# Patient Record
Sex: Female | Born: 1935
Health system: Southern US, Community
[De-identification: ages and names within clinical notes are randomized; demographics above are authoritative.]

## PROBLEM LIST (undated history)

## (undated) DIAGNOSIS — I739 Peripheral vascular disease, unspecified: Secondary | ICD-10-CM

## (undated) DIAGNOSIS — E039 Hypothyroidism, unspecified: Secondary | ICD-10-CM

## (undated) DIAGNOSIS — G4730 Sleep apnea, unspecified: Secondary | ICD-10-CM

## (undated) DIAGNOSIS — I779 Disorder of arteries and arterioles, unspecified: Secondary | ICD-10-CM

## (undated) DIAGNOSIS — K219 Gastro-esophageal reflux disease without esophagitis: Secondary | ICD-10-CM

## (undated) DIAGNOSIS — I509 Heart failure, unspecified: Secondary | ICD-10-CM

## (undated) DIAGNOSIS — I10 Essential (primary) hypertension: Secondary | ICD-10-CM

## (undated) DIAGNOSIS — I499 Cardiac arrhythmia, unspecified: Secondary | ICD-10-CM

## (undated) DIAGNOSIS — R55 Syncope and collapse: Secondary | ICD-10-CM

## (undated) DIAGNOSIS — Z950 Presence of cardiac pacemaker: Secondary | ICD-10-CM

## (undated) HISTORY — DX: Sleep apnea, unspecified: G47.30

## (undated) HISTORY — DX: Peripheral vascular disease, unspecified: I73.9

## (undated) HISTORY — DX: Heart failure, unspecified: I50.9

## (undated) HISTORY — DX: Syncope and collapse: R55

## (undated) HISTORY — PX: BACK SURGERY: SHX140

## (undated) HISTORY — DX: Gastro-esophageal reflux disease without esophagitis: K21.9

## (undated) HISTORY — DX: Presence of cardiac pacemaker: Z95.0

## (undated) HISTORY — DX: Hypothyroidism, unspecified: E03.9

## (undated) HISTORY — PX: PACEMAKER INSERTION: SHX728

## (undated) HISTORY — DX: Essential (primary) hypertension: I10

## (undated) HISTORY — DX: Disorder of arteries and arterioles, unspecified: I77.9

---

## 2002-08-26 ENCOUNTER — Encounter (HOSPITAL_COMMUNITY): Admission: RE | Admit: 2002-08-26 | Discharge: 2002-09-25 | Payer: Self-pay | Admitting: Internal Medicine

## 2002-08-27 ENCOUNTER — Encounter: Payer: Self-pay | Admitting: Internal Medicine

## 2002-10-10 ENCOUNTER — Inpatient Hospital Stay (HOSPITAL_COMMUNITY): Admission: AD | Admit: 2002-10-10 | Discharge: 2002-10-14 | Payer: Self-pay | Admitting: Internal Medicine

## 2003-08-20 ENCOUNTER — Other Ambulatory Visit: Admission: RE | Admit: 2003-08-20 | Discharge: 2003-08-20 | Payer: Self-pay | Admitting: Dermatology

## 2003-10-12 ENCOUNTER — Other Ambulatory Visit: Admission: RE | Admit: 2003-10-12 | Discharge: 2003-10-12 | Payer: Self-pay | Admitting: Dermatology

## 2003-12-29 ENCOUNTER — Ambulatory Visit (HOSPITAL_COMMUNITY): Admission: RE | Admit: 2003-12-29 | Discharge: 2003-12-29 | Payer: Self-pay | Admitting: Internal Medicine

## 2005-03-06 ENCOUNTER — Encounter (HOSPITAL_COMMUNITY): Admission: RE | Admit: 2005-03-06 | Discharge: 2005-04-05 | Payer: Self-pay | Admitting: Orthopaedic Surgery

## 2007-04-08 ENCOUNTER — Ambulatory Visit (HOSPITAL_COMMUNITY): Admission: RE | Admit: 2007-04-08 | Discharge: 2007-04-08 | Payer: Self-pay | Admitting: Internal Medicine

## 2007-04-16 ENCOUNTER — Ambulatory Visit: Payer: Self-pay | Admitting: Vascular Surgery

## 2007-05-02 ENCOUNTER — Ambulatory Visit: Payer: Self-pay | Admitting: Vascular Surgery

## 2007-05-02 ENCOUNTER — Encounter: Payer: Self-pay | Admitting: Vascular Surgery

## 2007-05-02 ENCOUNTER — Inpatient Hospital Stay (HOSPITAL_COMMUNITY): Admission: AD | Admit: 2007-05-02 | Discharge: 2007-05-06 | Payer: Self-pay | Admitting: Vascular Surgery

## 2007-05-21 ENCOUNTER — Ambulatory Visit: Payer: Self-pay | Admitting: Vascular Surgery

## 2007-11-12 ENCOUNTER — Ambulatory Visit: Payer: Self-pay | Admitting: Vascular Surgery

## 2008-05-14 ENCOUNTER — Ambulatory Visit: Payer: Self-pay | Admitting: *Deleted

## 2009-06-27 ENCOUNTER — Encounter: Payer: Self-pay | Admitting: Emergency Medicine

## 2009-06-27 ENCOUNTER — Inpatient Hospital Stay (HOSPITAL_COMMUNITY): Admission: EM | Admit: 2009-06-27 | Discharge: 2009-07-02 | Payer: Self-pay | Admitting: Internal Medicine

## 2009-06-27 ENCOUNTER — Ambulatory Visit: Payer: Self-pay | Admitting: Cardiovascular Disease

## 2009-06-28 ENCOUNTER — Encounter: Payer: Self-pay | Admitting: Cardiovascular Disease

## 2009-06-29 ENCOUNTER — Encounter: Payer: Self-pay | Admitting: Internal Medicine

## 2009-06-30 ENCOUNTER — Encounter: Payer: Self-pay | Admitting: Internal Medicine

## 2009-07-16 ENCOUNTER — Encounter (INDEPENDENT_AMBULATORY_CARE_PROVIDER_SITE_OTHER): Payer: Self-pay | Admitting: *Deleted

## 2009-07-22 ENCOUNTER — Encounter: Payer: Self-pay | Admitting: Internal Medicine

## 2009-07-22 ENCOUNTER — Ambulatory Visit: Payer: Self-pay

## 2009-07-23 ENCOUNTER — Inpatient Hospital Stay (HOSPITAL_COMMUNITY): Admission: EM | Admit: 2009-07-23 | Discharge: 2009-07-27 | Payer: Self-pay | Admitting: Emergency Medicine

## 2009-09-29 ENCOUNTER — Encounter (INDEPENDENT_AMBULATORY_CARE_PROVIDER_SITE_OTHER): Payer: Self-pay | Admitting: *Deleted

## 2009-11-01 ENCOUNTER — Ambulatory Visit: Payer: Self-pay | Admitting: Surgery

## 2009-11-27 ENCOUNTER — Inpatient Hospital Stay (HOSPITAL_COMMUNITY)
Admission: EM | Admit: 2009-11-27 | Discharge: 2009-11-29 | Payer: Self-pay | Source: Home / Self Care | Admitting: Emergency Medicine

## 2009-12-02 ENCOUNTER — Ambulatory Visit: Payer: Self-pay | Admitting: Internal Medicine

## 2009-12-02 DIAGNOSIS — I10 Essential (primary) hypertension: Secondary | ICD-10-CM | POA: Insufficient documentation

## 2009-12-02 DIAGNOSIS — I471 Supraventricular tachycardia: Secondary | ICD-10-CM | POA: Insufficient documentation

## 2009-12-02 DIAGNOSIS — Z950 Presence of cardiac pacemaker: Secondary | ICD-10-CM | POA: Insufficient documentation

## 2009-12-03 ENCOUNTER — Encounter: Payer: Self-pay | Admitting: Internal Medicine

## 2010-05-15 ENCOUNTER — Encounter: Payer: Self-pay | Admitting: Internal Medicine

## 2010-05-26 NOTE — Procedures (Signed)
Summary: wch/resch/jss   Current Medications (verified): 1)  Norvasc 10 Mg Tabs (Amlodipine Besylate) .... One By Mouth Daily 2)  Aspir-Low 81 Mg Tbec (Aspirin) .... One By Mouth Daily 3)  Levothroid 88 Mcg Tabs (Levothyroxine Sodium) .... One By Mouth Daily 4)  Lisinopril 20 Mg Tabs (Lisinopril) .... One By Mouth Daily 5)  Omeprazole 20 Mg Cpdr (Omeprazole) .... One By Mouth Daily 6)  Lasix 20 Mg Tabs (Furosemide) .... One By Mouth Daily 7)  Colcrys 0.6 Mg Tabs (Colchicine) .... One By Mouth Three Times A Day For 5 Days 8)  Oxycontin 10 Mg Xr12h-Tab (Oxycodone Hcl) .... One By Mouth Two Times A Day As Needed 9)  Imodium A-D 2 Mg Tabs (Loperamide Hcl) .... One By Mouth Three Times A Day As Needed 10)  Promethazine Hcl 25 Mg Tabs (Promethazine Hcl) .... One By Mouth Every 8 Hours As Neeed.  Allergies (verified): 1)  Penicillin 2)  Sulfa  PPM Specifications Following MD:  Hillis Range, MD     PPM Vendor:  Medtronic     PPM Model Number:  ADDRL1     PPM Serial Number:  [id number] PPM DOI:  06/29/2009     PPM Implanting MD:  Hillis Range, MD  Lead 1    Location: RA     DOI: 06/29/2009     Model #: [id number]     Serial #: [id number]     Status: active Lead 2    Location: RV     DOI: 06/29/2009     Model #: [id number]     Serial #: [id number]     Status: active  Magnet Response Rate:  BOL 85 ERI 65  Indications:  AV block Type II   PPM Follow Up Remote Check?  No Battery Voltage:  2.80 V     Battery Est. Longevity:  9 years     Pacer Dependent:  No       PPM Device Measurements Atrium  Amplitude: 1.4 mV, Impedance: 400 ohms, Threshold: 0.5 V at 0.4 msec Right Ventricle  Amplitude: 15.68 mV, Impedance: 648 ohms, Threshold: 0.625 V at 0.4 msec  Episodes MS Episodes:  0     Percent Mode Switch:  0     Coumadin:  No Ventricular High Rate:  0     Atrial Pacing:  4.6%     Ventricular Pacing:  40.1%  Parameters Mode:  DDD+     Lower Rate Limit:  60     Upper Rate Limit:  130 Paced AV Delay:   150     Sensed AV Delay:  120 Next Cardiology Appt Due:  09/22/2009 Tech Comments:  Reprogrammed DDD+.  Steri strips removed, no redness or edema.  She will follow with Dr. Ladona Ridgel in the [facility] office. Altha Harm, LPN  July 22, 2009 2:43 PM

## 2010-05-26 NOTE — Letter (Signed)
Summary: Appointment - Missed  [facility] [place]  [address] [place] [address]   Phone: [phone]  Fax: [phone]     September 29, 2009 MRN: [id number]   [facility] [address] [place], [place]  [address]   Dear Ms. Ehrich,  Our records indicate you missed your appointment on     09/29/09                   with Dr.       Ladona Ridgel                                    It is very important that we reach you to reschedule this appointment. We look forward to participating in your health care needs. Please contact us at the number listed above at your earliest convenience to reschedule this appointment.     Sincerely,    Glass blower/designer

## 2010-05-26 NOTE — Cardiovascular Report (Signed)
Summary: Office Visit   Office Visit   Imported By: Roderic Ovens 07/30/2009 13:27:24  _____________________________________________________________________  External Attachment:    Type:   Image     Comment:   External Document

## 2010-05-26 NOTE — Letter (Signed)
Summary: Appointment - Missed   [facility]  [address]   [place], [place] [address]   Phone: [phone]  Fax: [phone]     July 16, 2009 MRN: [id number]   [facility] [address] [place], [place]  [address]   Dear Ms. Kreitz,  Our records indicate you missed your appointment on 07/14/2009 with [facility].It is very important that we reach you to reschedule this appointment. We look forward to participating in your health care needs. Please contact us at the number listed above at your earliest convenience to reschedule this appointment.     Sincerely,   Ruel Favors Scheduling Team

## 2010-05-26 NOTE — Miscellaneous (Signed)
Summary: Device preload  Clinical Lists Changes  Observations: Added new observation of PPM INDICATN: AV block Type II (06/29/2009 16:09) Added new observation of MAGNET RTE: BOL 85 ERI 65 (06/29/2009 16:09) Added new observation of PPMLEADSTAT2: active (06/29/2009 16:09) Added new observation of PPMLEADSER2: [id number] (06/29/2009 16:09) Added new observation of PPMLEADMOD2: 5092  (06/29/2009 16:09) Added new observation of PPMLEADLOC2: RV  (06/29/2009 16:09) Added new observation of PPMLEADSTAT1: active  (06/29/2009 16:09) Added new observation of PPMLEADSER1: [id number]  (06/29/2009 16:09) Added new observation of PPMLEADMOD1: 5076  (06/29/2009 16:09) Added new observation of PPMLEADLOC1: RA  (06/29/2009 16:09) Added new observation of PPM IMP MD: Hillis Range, MD  (06/29/2009 16:09) Added new observation of PPMLEADDOI2: 06/29/2009  (06/29/2009 16:09) Added new observation of PPMLEADDOI1: 06/29/2009  (06/29/2009 16:09) Added new observation of PPM DOI: 06/29/2009  (06/29/2009 16:09) Added new observation of PPM SERL#: [id number]  (06/29/2009 16:09) Added new observation of PPM MODL#: [id number]  (06/29/2009 36:64) Added new observation of PACEMAKERMFG: Medtronic  (06/29/2009 16:09) Added new observation of PACEMAKER MD: Hillis Range, MD  (06/29/2009 16:09)      PPM Specifications Following MD:  Hillis Range, MD     PPM Vendor:  Medtronic     PPM Model Number:  ADDRL1     PPM Serial Number:  [id number] PPM DOI:  06/29/2009     PPM Implanting MD:  Hillis Range, MD  Lead 1    Location: RA     DOI: 06/29/2009     Model #: 5638     Serial #: [id number]     Status: active Lead 2    Location: RV     DOI: 06/29/2009     Model #: [id number]     Serial #: [id number]     Status: active  Magnet Response Rate:  BOL 85 ERI 65  Indications:  AV block Type II

## 2010-05-26 NOTE — Procedures (Signed)
Summary: PC2   Visit Type:  Follow-up Primary Lazaro Isenhower:  Osborne Casco  CC:  no cardiology complaints.  History of Present Illness: Mrs. Canedo returns for followup.  She is a pleasant 77 yo woman with a h/o symptomatic bradycardia, s/p PPM, who also has HTN and arthritis.  She had a spell of near syncope a couple of weeks ago and was evaluated at [facility] hospital where she was stable and discharged with a presumptive diagnosis of dysautonomia.  Interogation of her PPM demonstrated that she had had no arrhythmias.  She denies c/p or sob.  She has multiple joint aches.  Current Medications (verified): 1)  Norvasc 10 Mg Tabs (Amlodipine Besylate) .... One By Mouth Daily 2)  Aspir-Low 81 Mg Tbec (Aspirin) .... One By Mouth Daily 3)  Levothroid 88 Mcg Tabs (Levothyroxine Sodium) .... One By Mouth Daily 4)  Lisinopril 20 Mg Tabs (Lisinopril) .... One By Mouth Daily 5)  Aciphex 20 Mg Tbec (Rabeprazole Sodium) .... Take 1 Tab Daily  Allergies (verified): 1)  Penicillin 2)  Sulfa  Review of Systems       The patient complains of syncope.  The patient denies chest pain, dyspnea on exertion, and peripheral edema.    Vital Signs:  Patient profile:   77 year old female Height:      62 inches Weight:      161 pounds BMI:     29.55 Pulse rate:   90 / minute BP sitting:   143 / 73  (right arm)  Vitals Entered By: Dreama Saa, CNA (December 02, 2009 11:02 AM)  Physical Exam  General:  Elderly, well developed, well nourished, in no acute distress.  HEENT: normal except for alopecia Neck: supple. No JVD. Carotids 2+ bilaterally no bruits Cor: RRR no rubs, gallops or murmur Lungs: CTA. Well healed PPM incision. Ab: soft, nontender. nondistended. No HSM. Good bowel sounds Ext: warm. no cyanosis, clubbing or edema Neuro: alert and oriented. Grossly nonfocal. affect pleasant    PPM Specifications Following MD:  Hillis Range, MD     PPM Vendor:  Medtronic     PPM Model Number:  ADDRL1      PPM Serial Number:  [id number] PPM DOI:  06/29/2009     PPM Implanting MD:  Hillis Range, MD  Lead 1    Location: RA     DOI: 06/29/2009     Model #: 4098     Serial #: [id number]     Status: active Lead 2    Location: RV     DOI: 06/29/2009     Model #: 6213     Serial #: [id number]     Status: active  Magnet Response Rate:  BOL 85 ERI 65  Indications:  AV block Type II   PPM Follow Up Remote Check?  No Battery Voltage:  2.80 V     Battery Est. Longevity:  13.5 years     Pacer Dependent:  No       PPM Device Measurements Atrium  Amplitude: 1.4 mV, Impedance: 450 ohms, Threshold: 0.5 V at 0.4 msec Right Ventricle  Amplitude: 8.0 mV, Impedance: 580 ohms, Threshold: 0.375 V at 0.4 msec  Episodes MS Episodes:  6     Percent Mode Switch:  <0.1%     Coumadin:  No Ventricular High Rate:  1     Atrial Pacing:  0.2%     Ventricular Pacing:  50.2%  Parameters Mode:  DDD+     Lower Rate  Limit:  60     Upper Rate Limit:  130 Paced AV Delay:  150     Sensed AV Delay:  120 Next Cardiology Appt Due:  06/23/2010 Tech Comments:  Ms. Blowe was seen today for a near syncopal episode 8/6.  No episodes recorded on that day.  No parameter changes.  1VHR episode 3 seconds.  ROV 3/12 with Dr. Ladona Ridgel in [facility]. Altha Harm, LPN  December 02, 2009 11:27 AM  MD Comments:  Agree with above.  Impression & Recommendations:  Problem # 1:  CARDIAC PACEMAKER IN SITU (ICD-V45.01) Her device is working normally.  Will recheck in several months.  Problem # 2:  ESSENTIAL HYPERTENSION, BENIGN (ICD-401.1) Her blood pressure is a bit elevated today but in light of her propensity for probable syncope, would allow it to run a bit on the high side particularly with her advanced age. The following medications were removed from the medication list:    Lasix 20 Mg Tabs (Furosemide) ..... One by mouth daily Her updated medication list for this problem includes:    Norvasc 10 Mg Tabs (Amlodipine besylate) ..... One by  mouth daily    Aspir-low 81 Mg Tbec (Aspirin) ..... One by mouth daily    Lisinopril 20 Mg Tabs (Lisinopril) ..... One by mouth daily  Problem # 3:  SUPRAVENTRICULAR ARRHYTHMIA (ICD-427.0) Interogation of her PM demonstrates some non-sustained SVT.  This appears to be asymptomatic and does not coincide with her prior episode of near syncope. Her updated medication list for this problem includes:    Norvasc 10 Mg Tabs (Amlodipine besylate) ..... One by mouth daily    Aspir-low 81 Mg Tbec (Aspirin) ..... One by mouth daily    Lisinopril 20 Mg Tabs (Lisinopril) ..... One by mouth daily  Patient Instructions: 1)  Your physician recommends that you schedule a follow-up appointment in: March, 2012

## 2010-07-08 LAB — URINALYSIS, ROUTINE W REFLEX MICROSCOPIC
Glucose, UA: NEGATIVE mg/dL
Hgb urine dipstick: NEGATIVE
Leukocytes, UA: NEGATIVE
Nitrite: NEGATIVE
Protein, ur: 30 mg/dL — AB
Specific Gravity, Urine: >1.03 — ABNORMAL HIGH (ref 1.005–1.030)
Urobilinogen, UA: 1 mg/dL (ref 0.0–1.0)
pH: 5 (ref 5.0–8.0)

## 2010-07-08 LAB — CBC
HCT: 34.3 % — ABNORMAL LOW (ref 36.0–46.0)
Hemoglobin: 11.6 g/dL — ABNORMAL LOW (ref 12.0–15.0)
MCH: 30.1 pg (ref 26.0–34.0)
MCHC: 33.6 g/dL (ref 30.0–36.0)
MCV: 89.6 fL (ref 78.0–100.0)
Platelets: 157 10*3/uL (ref 150–400)
RBC: 3.83 MIL/uL — ABNORMAL LOW (ref 3.87–5.11)
RDW: 13.8 % (ref 11.5–15.5)
WBC: 4.9 10*3/uL (ref 4.0–10.5)

## 2010-07-08 LAB — DIFFERENTIAL
Basophils Absolute: 0 10*3/uL (ref 0.0–0.1)
Basophils Relative: 1 % (ref 0–1)
Eosinophils Absolute: 0.2 10*3/uL (ref 0.0–0.7)
Eosinophils Relative: 4 % (ref 0–5)
Lymphocytes Relative: 19 % (ref 12–46)
Lymphs Abs: 0.9 10*3/uL (ref 0.7–4.0)
Monocytes Absolute: 0.4 10*3/uL (ref 0.1–1.0)
Monocytes Relative: 8 % (ref 3–12)
Neutro Abs: 3.4 10*3/uL (ref 1.7–7.7)
Neutrophils Relative %: 69 % (ref 43–77)

## 2010-07-08 LAB — GLUCOSE, CAPILLARY: Glucose-Capillary: 104 mg/dL — ABNORMAL HIGH (ref 70–99)

## 2010-07-08 LAB — CARDIAC PANEL(CRET KIN+CKTOT+MB+TROPI)
CK, MB: 0.8 ng/mL (ref 0.3–4.0)
CK, MB: 0.9 ng/mL (ref 0.3–4.0)
Relative Index: INVALID (ref 0.0–2.5)
Relative Index: INVALID (ref 0.0–2.5)
Relative Index: INVALID (ref 0.0–2.5)
Total CK: 20 U/L (ref 7–177)
Total CK: 20 U/L (ref 7–177)
Total CK: 21 U/L (ref 7–177)
Troponin I: 0.01 ng/mL (ref 0.00–0.06)

## 2010-07-08 LAB — URINE MICROSCOPIC-ADD ON

## 2010-07-08 LAB — URINE CULTURE
Colony Count: >=100000
Culture  Setup Time: 201108071946
Special Requests: NEGATIVE

## 2010-07-08 LAB — BASIC METABOLIC PANEL
BUN: 18 mg/dL (ref 6–23)
CO2: 26 mEq/L (ref 19–32)
Calcium: 9.6 mg/dL (ref 8.4–10.5)
Chloride: 107 mEq/L (ref 96–112)
Creatinine, Ser: 1.25 mg/dL — ABNORMAL HIGH (ref 0.4–1.2)
GFR calc Af Amer: 49 mL/min — ABNORMAL LOW (ref >60)
GFR calc non Af Amer: 40 mL/min — ABNORMAL LOW (ref >60)
Glucose, Bld: 115 mg/dL — ABNORMAL HIGH (ref 70–99)
Potassium: 4.1 mEq/L (ref 3.5–5.1)
Sodium: 136 mEq/L (ref 135–145)

## 2010-07-08 LAB — COMPREHENSIVE METABOLIC PANEL
Albumin: 3.3 g/dL — ABNORMAL LOW (ref 3.5–5.2)
BUN: 17 mg/dL (ref 6–23)
Calcium: 9 mg/dL (ref 8.4–10.5)
Creatinine, Ser: 1.11 mg/dL (ref 0.4–1.2)
Potassium: 4.3 mEq/L (ref 3.5–5.1)
Total Protein: 5.9 g/dL — ABNORMAL LOW (ref 6.0–8.3)

## 2010-07-08 LAB — POCT CARDIAC MARKERS
CKMB, poc: <1 ng/mL — ABNORMAL LOW (ref 1.0–8.0)
Myoglobin, poc: 67.2 ng/mL (ref 12–200)
Troponin i, poc: <0.05 ng/mL (ref 0.00–0.09)

## 2010-07-08 LAB — BRAIN NATRIURETIC PEPTIDE: Pro B Natriuretic peptide (BNP): 71.2 pg/mL (ref 0.0–100.0)

## 2010-07-08 LAB — MAGNESIUM: Magnesium: 2 mg/dL (ref 1.5–2.5)

## 2010-07-08 LAB — PHOSPHORUS: Phosphorus: 3.3 mg/dL (ref 2.3–4.6)

## 2010-07-13 LAB — DIFFERENTIAL
Basophils Absolute: 0 10*3/uL (ref 0.0–0.1)
Basophils Absolute: 0 10*3/uL (ref 0.0–0.1)
Eosinophils Absolute: 0.1 10*3/uL (ref 0.0–0.7)
Eosinophils Absolute: 0.1 10*3/uL (ref 0.0–0.7)
Eosinophils Absolute: 0.1 10*3/uL (ref 0.0–0.7)
Eosinophils Relative: 3 % (ref 0–5)
Eosinophils Relative: 4 % (ref 0–5)
Lymphocytes Relative: 15 % (ref 12–46)
Lymphocytes Relative: 21 % (ref 12–46)
Lymphs Abs: 0.3 10*3/uL — ABNORMAL LOW (ref 0.7–4.0)
Lymphs Abs: 0.5 10*3/uL — ABNORMAL LOW (ref 0.7–4.0)
Monocytes Absolute: 0.2 10*3/uL (ref 0.1–1.0)
Monocytes Absolute: 0.3 10*3/uL (ref 0.1–1.0)
Monocytes Relative: 12 % (ref 3–12)
Monocytes Relative: 14 % — ABNORMAL HIGH (ref 3–12)
Neutro Abs: 1.2 10*3/uL — ABNORMAL LOW (ref 1.7–7.7)
Neutrophils Relative %: 68 % (ref 43–77)
Neutrophils Relative %: 72 % (ref 43–77)

## 2010-07-13 LAB — CBC
HCT: 30.8 % — ABNORMAL LOW (ref 36.0–46.0)
HCT: 30.8 % — ABNORMAL LOW (ref 36.0–46.0)
HCT: 31.9 % — ABNORMAL LOW (ref 36.0–46.0)
Hemoglobin: 10.7 g/dL — ABNORMAL LOW (ref 12.0–15.0)
Hemoglobin: 12.6 g/dL (ref 12.0–15.0)
MCHC: 34.5 g/dL (ref 30.0–36.0)
MCHC: 34.8 g/dL (ref 30.0–36.0)
MCV: 88.8 fL (ref 78.0–100.0)
MCV: 89.2 fL (ref 78.0–100.0)
Platelets: 87 10*3/uL — ABNORMAL LOW (ref 150–400)
Platelets: 98 10*3/uL — ABNORMAL LOW (ref 150–400)
RBC: 3.46 MIL/uL — ABNORMAL LOW (ref 3.87–5.11)
RBC: 4.07 MIL/uL (ref 3.87–5.11)
RDW: 14.1 % (ref 11.5–15.5)
WBC: 2.3 10*3/uL — ABNORMAL LOW (ref 4.0–10.5)
WBC: 3.3 10*3/uL — ABNORMAL LOW (ref 4.0–10.5)

## 2010-07-13 LAB — URINALYSIS, MICROSCOPIC ONLY
Hgb urine dipstick: NEGATIVE
Nitrite: NEGATIVE
Specific Gravity, Urine: 1.025 (ref 1.005–1.030)
Urobilinogen, UA: 0.2 mg/dL (ref 0.0–1.0)

## 2010-07-13 LAB — COMPREHENSIVE METABOLIC PANEL
ALT: 42 U/L — ABNORMAL HIGH (ref 0–35)
ALT: 56 U/L — ABNORMAL HIGH (ref 0–35)
AST: 123 U/L — ABNORMAL HIGH (ref 0–37)
Albumin: 2.4 g/dL — ABNORMAL LOW (ref 3.5–5.2)
Albumin: 2.5 g/dL — ABNORMAL LOW (ref 3.5–5.2)
Alkaline Phosphatase: 146 U/L — ABNORMAL HIGH (ref 39–117)
Alkaline Phosphatase: 152 U/L — ABNORMAL HIGH (ref 39–117)
BUN: 15 mg/dL (ref 6–23)
BUN: 31 mg/dL — ABNORMAL HIGH (ref 6–23)
CO2: 21 mEq/L (ref 19–32)
CO2: 22 mEq/L (ref 19–32)
CO2: 24 mEq/L (ref 19–32)
Calcium: 8.6 mg/dL (ref 8.4–10.5)
Calcium: 9.2 mg/dL (ref 8.4–10.5)
Chloride: 104 mEq/L (ref 96–112)
Chloride: 110 mEq/L (ref 96–112)
Chloride: 110 mEq/L (ref 96–112)
Creatinine, Ser: 1.31 mg/dL — ABNORMAL HIGH (ref 0.4–1.2)
Creatinine, Ser: 1.78 mg/dL — ABNORMAL HIGH (ref 0.4–1.2)
Creatinine, Ser: 2.02 mg/dL — ABNORMAL HIGH (ref 0.4–1.2)
GFR calc non Af Amer: 23 mL/min — ABNORMAL LOW (ref >60)
GFR calc non Af Amer: 27 mL/min — ABNORMAL LOW (ref >60)
GFR calc non Af Amer: 33 mL/min — ABNORMAL LOW (ref >60)
Glucose, Bld: 115 mg/dL — ABNORMAL HIGH (ref 70–99)
Glucose, Bld: 84 mg/dL (ref 70–99)
Potassium: 3.5 mEq/L (ref 3.5–5.1)
Potassium: 4.2 mEq/L (ref 3.5–5.1)
Sodium: 136 mEq/L (ref 135–145)
Sodium: 136 mEq/L (ref 135–145)
Total Bilirubin: 0.6 mg/dL (ref 0.3–1.2)
Total Bilirubin: 0.7 mg/dL (ref 0.3–1.2)
Total Bilirubin: 0.7 mg/dL (ref 0.3–1.2)
Total Protein: 5.2 g/dL — ABNORMAL LOW (ref 6.0–8.3)

## 2010-07-13 LAB — FECAL LACTOFERRIN, QUANT: Fecal Lactoferrin: POSITIVE

## 2010-07-13 LAB — CLOSTRIDIUM DIFFICILE EIA: C difficile Toxins A+B, EIA: NEGATIVE

## 2010-07-13 LAB — LIPASE, BLOOD: Lipase: 39 U/L (ref 11–59)

## 2010-07-13 LAB — POCT CARDIAC MARKERS: Troponin i, poc: <0.05 ng/mL (ref 0.00–0.09)

## 2010-07-13 LAB — OVA AND PARASITE EXAMINATION: Ova and parasites: NONE SEEN

## 2010-07-13 LAB — STOOL CULTURE

## 2010-07-17 LAB — COMPREHENSIVE METABOLIC PANEL
Albumin: 3.7 g/dL (ref 3.5–5.2)
BUN: 25 mg/dL — ABNORMAL HIGH (ref 6–23)
Calcium: 9.4 mg/dL (ref 8.4–10.5)
Chloride: 106 mEq/L (ref 96–112)
Creatinine, Ser: 1.45 mg/dL — ABNORMAL HIGH (ref 0.4–1.2)
Total Bilirubin: 0.8 mg/dL (ref 0.3–1.2)
Total Protein: 7.3 g/dL (ref 6.0–8.3)

## 2010-07-17 LAB — URINALYSIS, ROUTINE W REFLEX MICROSCOPIC
Bilirubin Urine: NEGATIVE
Glucose, UA: NEGATIVE mg/dL
Hgb urine dipstick: NEGATIVE
Protein, ur: NEGATIVE mg/dL
Specific Gravity, Urine: 1.01 (ref 1.005–1.030)
pH: 5 (ref 5.0–8.0)

## 2010-07-17 LAB — CBC
HCT: 31.8 % — ABNORMAL LOW (ref 36.0–46.0)
HCT: 31.9 % — ABNORMAL LOW (ref 36.0–46.0)
Hemoglobin: 10.7 g/dL — ABNORMAL LOW (ref 12.0–15.0)
Hemoglobin: 11.1 g/dL — ABNORMAL LOW (ref 12.0–15.0)
MCHC: 33.7 g/dL (ref 30.0–36.0)
MCHC: 34 g/dL (ref 30.0–36.0)
MCV: 93 fL (ref 78.0–100.0)
MCV: 93.3 fL (ref 78.0–100.0)
MCV: 93.4 fL (ref 78.0–100.0)
Platelets: 120 10*3/uL — ABNORMAL LOW (ref 150–400)
Platelets: 124 10*3/uL — ABNORMAL LOW (ref 150–400)
Platelets: 130 10*3/uL — ABNORMAL LOW (ref 150–400)
RBC: 3.43 MIL/uL — ABNORMAL LOW (ref 3.87–5.11)
RBC: 3.47 MIL/uL — ABNORMAL LOW (ref 3.87–5.11)
RDW: 14.2 % (ref 11.5–15.5)
WBC: 5.4 10*3/uL (ref 4.0–10.5)
WBC: 6.6 10*3/uL (ref 4.0–10.5)
WBC: 8.1 10*3/uL (ref 4.0–10.5)

## 2010-07-17 LAB — BASIC METABOLIC PANEL
BUN: 25 mg/dL — ABNORMAL HIGH (ref 6–23)
BUN: 25 mg/dL — ABNORMAL HIGH (ref 6–23)
BUN: 28 mg/dL — ABNORMAL HIGH (ref 6–23)
CO2: 26 mEq/L (ref 19–32)
CO2: 28 mEq/L (ref 19–32)
CO2: 28 mEq/L (ref 19–32)
Calcium: 8.9 mg/dL (ref 8.4–10.5)
Chloride: 104 mEq/L (ref 96–112)
Chloride: 105 mEq/L (ref 96–112)
Chloride: 106 mEq/L (ref 96–112)
Chloride: 107 mEq/L (ref 96–112)
Chloride: 107 mEq/L (ref 96–112)
Creatinine, Ser: 1.46 mg/dL — ABNORMAL HIGH (ref 0.4–1.2)
Creatinine, Ser: 1.59 mg/dL — ABNORMAL HIGH (ref 0.4–1.2)
Creatinine, Ser: 1.76 mg/dL — ABNORMAL HIGH (ref 0.4–1.2)
GFR calc Af Amer: 33 mL/min — ABNORMAL LOW (ref >60)
GFR calc non Af Amer: 31 mL/min — ABNORMAL LOW (ref >60)
Glucose, Bld: 101 mg/dL — ABNORMAL HIGH (ref 70–99)
Potassium: 3.5 mEq/L (ref 3.5–5.1)
Potassium: 3.6 mEq/L (ref 3.5–5.1)
Potassium: 4.7 mEq/L (ref 3.5–5.1)
Sodium: 137 mEq/L (ref 135–145)
Sodium: 139 mEq/L (ref 135–145)

## 2010-07-17 LAB — DIFFERENTIAL
Basophils Absolute: 0 10*3/uL (ref 0.0–0.1)
Basophils Relative: 0 % (ref 0–1)
Eosinophils Absolute: 0.1 10*3/uL (ref 0.0–0.7)
Monocytes Relative: 9 % (ref 3–12)
Neutro Abs: 5.6 10*3/uL (ref 1.7–7.7)
Neutrophils Relative %: 80 % — ABNORMAL HIGH (ref 43–77)

## 2010-07-17 LAB — CARDIAC PANEL(CRET KIN+CKTOT+MB+TROPI)
CK, MB: 0.8 ng/mL (ref 0.3–4.0)
CK, MB: 0.9 ng/mL (ref 0.3–4.0)
Relative Index: INVALID (ref 0.0–2.5)
Relative Index: INVALID (ref 0.0–2.5)
Total CK: 33 U/L (ref 7–177)
Troponin I: 0.02 ng/mL (ref 0.00–0.06)
Troponin I: 0.02 ng/mL (ref 0.00–0.06)
Troponin I: 0.07 ng/mL — ABNORMAL HIGH (ref 0.00–0.06)

## 2010-07-17 LAB — PROTIME-INR
INR: 1.33 (ref 0.00–1.49)
Prothrombin Time: 16.4 seconds — ABNORMAL HIGH (ref 11.6–15.2)

## 2010-07-17 LAB — CK TOTAL AND CKMB (NOT AT ARMC)
CK, MB: 0.7 ng/mL (ref 0.3–4.0)
Relative Index: INVALID (ref 0.0–2.5)
Total CK: 27 U/L (ref 7–177)

## 2010-07-17 LAB — URINE CULTURE

## 2010-07-17 LAB — POCT CARDIAC MARKERS
CKMB, poc: <1 ng/mL — ABNORMAL LOW (ref 1.0–8.0)
Myoglobin, poc: 93.8 ng/mL (ref 12–200)

## 2010-07-17 LAB — MRSA PCR SCREENING: MRSA by PCR: NEGATIVE

## 2010-07-17 LAB — BRAIN NATRIURETIC PEPTIDE: Pro B Natriuretic peptide (BNP): 763 pg/mL — ABNORMAL HIGH (ref 0.0–100.0)

## 2010-07-17 LAB — TSH: TSH: 1.411 u[IU]/mL (ref 0.350–4.500)

## 2010-08-16 ENCOUNTER — Ambulatory Visit (INDEPENDENT_AMBULATORY_CARE_PROVIDER_SITE_OTHER): Payer: Medicare Other | Admitting: Internal Medicine

## 2010-08-16 ENCOUNTER — Encounter: Payer: Self-pay | Admitting: Internal Medicine

## 2010-08-16 DIAGNOSIS — I10 Essential (primary) hypertension: Secondary | ICD-10-CM

## 2010-08-16 DIAGNOSIS — Z950 Presence of cardiac pacemaker: Secondary | ICD-10-CM

## 2010-08-16 DIAGNOSIS — I441 Atrioventricular block, second degree: Secondary | ICD-10-CM

## 2010-08-16 NOTE — Progress Notes (Signed)
HPI As Kelli [name] returns today for followup. She is a pleasant elderly woman with a history of symptomatic bradycardia status post permanent pacemaker insertion.Despite her advanced age, she continues to be quite active. She is still cooking for herself. She has had problems with sleep apnea and has tried to wear a CPAP machine. She has trouble keeping this on at night. She has difficulty with her sleeping. She has had no recurrent falls or syncope. Allergies  Allergen Reactions  . Penicillins   . Sulfonamide Derivatives      Current Outpatient Prescriptions  Medication Sig Dispense Refill  . amLODipine (NORVASC) 10 MG tablet Take 10 mg by mouth daily.        Marland Kitchen aspirin 81 MG tablet Take 81 mg by mouth daily.        . Levothyroxine Sodium 88 MCG CAPS Take 1 capsule by mouth daily.        Marland Kitchen lisinopril (PRINIVIL,ZESTRIL) 20 MG tablet Take 20 mg by mouth daily.        . pantoprazole (PROTONIX) 40 MG tablet Take 40 mg by mouth daily.        Marland Kitchen DISCONTD: RABEprazole (ACIPHEX) 20 MG tablet Take 20 mg by mouth daily.           Past Medical History  Diagnosis Date  . Vasovagal syncope   . Hypertension   . Pacemaker   . Carotid artery disease   . Hypothyroidism   . GERD (gastroesophageal reflux disease)   . CHF (congestive heart failure)     diastolic in nature  . Sleep apnea     ROS:   All systems reviewed and negative except as noted in the HPI.   Past Surgical History  Procedure Date  . Pacemaker insertion   . Back surgery      No family history on file.   History   Social History  . Marital Status: Widowed    Spouse Name: N/A    Number of Children: N/A  . Years of Education: N/A   Occupational History  . Not on file.   Social History Main Topics  . Smoking status: Never Smoker   . Smokeless tobacco: Never Used  . Alcohol Use: No  . Drug Use: No  . Sexually Active: Not on file   Other Topics Concern  . Not on file   Social History Narrative  . No narrative  on file     BP 153/69  Pulse 61  Ht 5\' 2"  (1.575 m)  Wt 158 lb (71.668 kg)  BMI 28.90 kg/m2  Physical Exam:  Elderly well appearing NAD HEENT: Unremarkable Neck:  No JVD, no thyromegally Lymphatics:  No adenopathy Back:  No CVA tenderness Lungs:  Clear HEART:  Regular rate rhythm, no murmurs, no rubs, no clicks Abd:  Flat, positive bowel sounds, no organomegally, no rebound, no guarding Ext:  2 plus pulses, no edema, no cyanosis, no clubbing Skin:  No rashes no nodules Neuro:  CN II through XII intact, motor grossly intact  DEVICE  Normal device function.  See PaceArt for details.   Assess/Plan:

## 2010-08-16 NOTE — Assessment & Plan Note (Signed)
Her device is working normally. Battery longevity is good. Will recheck in several months.

## 2010-08-16 NOTE — Assessment & Plan Note (Signed)
Her blood pressure is elevated today. Because of her propensity to fall which we think is related to orthostasis, I would not be particularly aggressive in trying to control her blood pressure. She will continue her current medications. We will avoid diuretics.

## 2010-08-16 NOTE — Patient Instructions (Signed)
**Note De-identified Kristeena Meineke Obfuscation** Your physician recommends that you schedule a follow-up appointment in: 1 year  

## 2010-09-06 NOTE — H&P (Signed)
HISTORY AND PHYSICAL EXAMINATION   April 16, 2007   Re:  Kelli [name], Kelli [name]               DOB:  [birth year]   CHIEF COMPLAINT:  Severe left internal carotid stenosis - asymptomatic  exception of dizziness.   HISTORY OF PRESENT ILLNESS:  This 77 year old female patient was  referred to the courtesy of Dr. Carylon Perches because of left carotid  occlusive disease which she discovered.  She denies any symptoms of  hemispheric TIAs, amaurosis fugax, diplopia, blurred vision, or syncope  but has had some significant dizziness and vertigo over the past several  months and on one occasion fell and hit her face.  She has had no  previous history of stroke.  Dr. Ouida Sills heard a left carotid bruit and  carotid duplex exam performed on April 08, 2007, revealed a 80% to  90% left internal carotid stenosis.  We repeated a carotid duplex exam  at the [facility] office which confirms an 80% left internal carotid stenosis  and mild flow reduction on the right side.   PAST MEDICAL HISTORY:  1 Hypertension.  2 Hyperlipidemia.  3 Sleep apnea.  4 Negative for diabetes, coronary artery disease, congestive heart  failure, COPD, and stroke.   PREVIOUS SURGERY:  1 Left knee replacement.  2 Left shoulder replacement.  3 Right hip replacement.   FAMILY HISTORY:  Positive for diabetes in 2 siblings.  Negative for  coronary artery disease and stroke.   SOCIAL HISTORY:  She is widowed.  Has 2 children.  Is retired.  She does  not use tobacco or alcohol.   REVIEW OF SYSTEMS:  Denies any anorexia, weight loss, fever.  Has  dyspnea on exertion.  No chest pain or orthopnea.  Denies any  bronchitis, hemoptysis, productive cough, wheezing, hematemesis, melena.  Does have some symptom reflux esophagitis.  Also has urinary frequency.  No history of rest pain, nonhealing ulcers, or claudication-type  symptoms.  She does have dizziness as noted and arthritis and difficulty  hearing.   ALLERGIES:   Sulfa.   MEDICATIONS:  Synthroid 1 tablet daily, amlodipine 5 mg 1 tablet daily,  furosemide 40 mg 1 tablet daily, lisinopril 40 mg 1 tablet daily,  Aciphex 1 pill daily, atenolol 100 mg 1 tablet daily, and aspirin 81 mg  1 tablet daily.   PHYSICAL EXAMINATION:  Vital Signs:  Blood pressure 120/67, heart rate  67, respirations are 18.  General:  She is an elderly female who is in  no apparent distress.  Alert and oriented x 3.  Neck:  Her neck is  supple, 3+ carotid pulses palpable.  There is a high-pitched whistling  bruit over the left carotid bifurcation.  No palpable adenopathy in the  neck.  Neurologic Examination:  Normal.  Chest:  Clear to auscultation.  Cardiovascular Exam:  Regular rate and rhythm with no murmurs.  Extremities:  Upper extremity pulse are 3+ bilaterally.  Skin:  No skin  rashes are noted.  Abdomen:  Soft, nontender with no palpable mass.  She  has 3+ femoral, popliteal and 2+ dorsalis pedis pulses bilaterally.   IMPRESSION:  1 Severe left internal carotid stenosis - asymptomatic  except for some generalized neurologic symptoms - nonspecific.  2 Sleep apnea by history.  3 Hypertension.  4 Hyperlipidemia.   PLAN:  Plan is to admit the patient on January 8 for an elective left  carotid endarterectomy.  This was thoroughly discussed with the patient  and her son with all options being presented and she they do want to  proceed with left carotid endarterectomy at this time.  Rest have been  thoroughly discussed.   Kelli [name], M.D.  Electronically Signed   JDL/MEDQ  D:  04/16/2007  T:  04/17/2007  Job:  [id number]

## 2010-09-06 NOTE — Assessment & Plan Note (Signed)
OFFICE VISIT   Kelli [name], Kelli [name]  DOB:  08/10/1915                                       11/12/2007  ZHYQM#:[id number]   The patient returns for 6 month followup regarding her left carotid  endarterectomy which I performed for severe left internal carotid  stenosis which was asymptomatic.  She has done well since that procedure  with no neurologic complications or specific complaints.  She has no  hoarseness and is swallowing well.  She denies any hemispheric or  nonhemispheric TIAs, amaurosis fugax, diplopia, blurred vision or  syncope.  She is having no chest pain or dyspnea on exertion and does  ambulate short distances without pain.  She takes one aspirin per day.   PHYSICAL EXAMINATION:  Blood pressure 160/65, heart rate 60,  respirations are 12.  Carotid pulses are 3+ with no audible bruit.  Left  neck incision remains well-healed.  Neurologic exam is normal.  No  palpable adenopathy in the neck.  Chest clear to auscultation.  Abdomen  soft, nontender with no masses.   Carotid duplex exam today reveals widely patent internal carotid  arteries bilaterally with no flow reduction.   I think she is getting along well and we will see her back in 6 months  with a followup carotid duplex exam unless she develops any neurologic  symptoms in the interim.   Kelli [name], M.D.  Electronically Signed   JDL/MEDQ  D:  11/12/2007  T:  11/14/2007  Job:  [id number]   cc:   Kingsley Callander. Ouida Sills, MD

## 2010-09-06 NOTE — Procedures (Signed)
CAROTID DUPLEX EXAM   INDICATION:  Follow-up known carotid artery disease.   HISTORY:  Diabetes:  No.  Cardiac:  No.  Hypertension:  Yes.  Smoking:  No.  Previous Surgery: No.  CV History:  No.  Amaurosis Fugax No, Paresthesias No, Hemiparesis No.                                       RIGHT             LEFT  Brachial systolic pressure:                           130  Brachial Doppler waveforms:         Biphasic          Biphasic  Vertebral direction of flow:        Antegrade         Antegrade  DUPLEX VELOCITIES (cm/sec)  CCA peak systolic                   74                63  ECA peak systolic                   115               396  ICA peak systolic                   65                375  ICA end diastolic                   20                68  PLAQUE MORPHOLOGY:                  Calcified         Calcified  PLAQUE AMOUNT:                      Moderate          Moderate to severe  PLAQUE LOCATION:                    ICA and ECA       ICA and ECA   IMPRESSION:  1. 60-79% stenosis noted in the left ICA, and left distal CCA and      bifurcation also 60-79%.  2. 20-39% stenosis noted in the right ICA.  3. Antegrade bilateral vertebral arteries.   ___________________________________________  Quita Skye Hart [place], M.D.   MG/MEDQ  D:  04/16/2007  T:  04/17/2007  Job:  [id number]

## 2010-09-06 NOTE — Assessment & Plan Note (Signed)
OFFICE VISIT   LUTHER, NEWHOUSE  DOB:  1916-04-01                                       05/21/2007  ZOXWR#:[id number]   The patient returns post left carotid endarterectomy on January 8 for  severe left internal carotid stenosis, which is asymptomatic.  She had  an uneventful postoperative course and returns today denying any  symptoms of hemispheric or non-hemispheric ischemia, amaurosis fugax,  stroke, or other neurologic symptoms.  She is ambulating well.  Has no  hoarseness, and is swallowing well.  She is taking 1 aspirin per day.   PHYSICAL EXAMINATION:  Blood pressure 133/61, heart rate is 57,  respirations 18.  Carotid pulses are 3+.  Left neck incision is well-  healed.  No bruits are audible on the left side.  Neurologic exam is  normal.   I reassured her regarding her progress and feel that she is doing quite  well, and will return in 6 months for a followup carotid duplex exam.  She questioned me about resuming her driving.  She is 77 years old, but  has driven fairly frequently in the past without difficulty.  I referred  her to her medical doctor, Dr. Ouida Sills, for further decisions regarding  how much driving she should be doing, but told her it would be okay to  resume all of her normal activities next week, since she will have  recuperated well from this surgical procedure.   Quita Skye Hart [place], M.D.  Electronically Signed   JDL/MEDQ  D:  05/21/2007  T:  05/22/2007  Job:  [id number]   cc:   Kingsley Callander. Ouida Sills, MD

## 2010-09-06 NOTE — Procedures (Signed)
CAROTID DUPLEX EXAM   INDICATION:  Followup carotid artery disease.   HISTORY:  Diabetes:  No.  Cardiac:  No.  Hypertension:  Yes.  Smoking:  No.  Previous Surgery:  Left CEA 05/02/2007 by Dr. Hart [place].  CV History:  No.  Amaurosis Fugax No, Paresthesias No, Hemiparesis No                                       RIGHT             LEFT  Brachial systolic pressure:         155               164  Brachial Doppler waveforms:         WNL               WNL  Vertebral direction of flow:        Antegrade         Antegrade  DUPLEX VELOCITIES (cm/sec)  CCA peak systolic                   78                72  ECA peak systolic                   125               134  ICA peak systolic                   85                108  ICA end diastolic                   25                28  PLAQUE MORPHOLOGY:                  Heterogenous      N/A  PLAQUE AMOUNT:                      Mild              N/A  PLAQUE LOCATION:                    ICA/ECA/CCA       N/A   IMPRESSION:  1. Right ICA shows evidence of 20-39% stenosis.  2. Left ICA shows no evidence of restenosis status post CEA.  3. No significant changes from previous study.   ___________________________________________  Quita Skye Hart [place], M.D.   AS/MEDQ  D:  05/14/2008  T:  05/14/2008  Job:  [id number]

## 2010-09-06 NOTE — Op Note (Signed)
Kelli [name], Kelli [name]               ACCOUNT NO.:  [id number]   MEDICAL RECORD NO.:  [id number]          PATIENT TYPE:  INP   LOCATION:  2899                         FACILITY:  [facility]   PHYSICIAN:  Quita Skye. Hart Rochester, M.D.  DATE OF BIRTH:  [birth year]   DATE OF PROCEDURE:  05/02/2007  DATE OF DISCHARGE:                               OPERATIVE REPORT   PREOPERATIVE DIAGNOSIS:  Severe left internal carotid stenosis,  asymptomatic.   POSTOPERATIVE DIAGNOSIS:  Severe left internal carotid stenosis,  asymptomatic.   OPERATION:  Left carotid endarterectomy with Dacron patch angioplasty.   SURGEON:  Quita Skye. Hart Rochester, M.D.   FIRST ASSISTANT:  Cyndy Freeze, PA   ANESTHESIA:  General endotracheal.   BRIEF HISTORY:  This 76 year old female was found to have a left carotid  bruit and Dr. Ouida Sills ordered a duplex scan which revealed an 80-90% left  internal carotid stenosis with mild flow reduction on the right side.  She has no history of stroke or any neurologic symptoms at this time and  was scheduled for an elective left carotid endarterectomy for prevention  of stroke.   PROCEDURE:  The patient was taken to the operating room and placed in  the supine position at which time satisfactory general endotracheal  anesthesia was administered.  The left neck was prepped with Betadine  scrub solution and draped in routine sterile manner.  Incision was made  along the anterior border of the sternocleidomastoid muscle and carried  down through subcutaneous tissue and platysma using the Bovie.  Common  facial vein and external jugular veins were ligated with 3-0 silk ties  and divided, exposing the common, internal and external carotid artery.  There was a calcified atherosclerotic plaque beginning in the distal  common carotid artery, extending up the internal carotid about 3-4 cm.  The distal vessel appeared normal.  The #10 shunt was prepared and the  patient was heparinized.  The carotid vessels  were occluded with  vascular clamps, longitudinal opening made in the common carotid with a  15 blade, extended up the internal carotid with Potts scissors to a  point distal to the disease.  A #10 shunt was inserted without  difficulty, reestablishing flow in about 2 minutes.  Standard  endarterectomy was then performed using the elevator and the Potts  scissors with an eversion endarterectomy of the external carotid.  The  plaque feathered off the distal internal carotid artery nicely, not  requiring any tacking sutures.  The lumen was thoroughly irrigated with  heparin and saline and all loose debris carefully removed and the  arteriotomy was closed with a Dacron patch using continuous 6-0 Prolene.  Prior to completion of the closure, the shunt was removed after about 30  minutes of shunt time.  Following antegrade and retrograde flushing the  closure was completed, with reestablishment of the flow initially up the  external and then at the internal branch.  Carotid  was occluded for less than 2 minutes for removal of the shunt.  Protamine was then given to reverse the heparin.  Following adequate  hemostasis, the wound  was irrigated with saline, closed in layers with  Vicryl in a subcuticular fashion.  Sterile dressing applied.  The  patient was taken to the recovery room in satisfactory condition.      Quita Skye Hart Rochester, M.D.  Electronically Signed     JDL/MEDQ  D:  05/02/2007  T:  05/02/2007  Job:  [id number]   cc:   Kingsley Callander. Ouida Sills, MD

## 2010-09-06 NOTE — Discharge Summary (Signed)
NAMEMALYNDA, [name]               ACCOUNT NO.:  [id number]   MEDICAL RECORD NO.:  [id number]          PATIENT TYPE:  INP   LOCATION:  2012                         FACILITY:  [facility]   PHYSICIAN:  Quita Skye. Hart Rochester, M.D.  DATE OF BIRTH:  [birth year]   DATE OF ADMISSION:  05/02/2007  DATE OF DISCHARGE:  05/05/2007                               DISCHARGE SUMMARY   DISCHARGE DIAGNOSES:  1. Severe left internal carotid artery stenosis--Assymptomatic.  2. Hypertension.  3. Hyperlipidemia.  4. Obstructive sleep apnea.  5. History of left knee replacement.  6. History of left shoulder replacement.  7. Right hip replacement.  8. Mild postoperative renal insufficiency with a creatinine of 1.51.  9. ALLERGY TO PENICILLIN AND SULFA.   PROCEDURES:  May 02, 2007:  Left carotid endarterectomy with Dacron  patch angioplasty by Dr. Josephina Gip.   BRIEF HISTORY:  Kelli [name] is an 77 year old female, who was referred  to Dr. Hart Rochester by Dr. Carylon Perches with a left carotid occlusive disease.  She has been asymptomatic, denied any symptoms for TIAs, amaurosis  fugax, diplopia, blurred vision, or syncope but did report some  dizziness and vertigo over the last several months and has fallen on one  occasion hitting her face.  She had had no previous history of stroke.  Dr. Ouida Sills had initially heard a left carotid bruit, and a carotid duplex  was performed on April 08, 2007, revealing 80 to 90% left internal  carotid artery stenosis.  The duplex was repeated in the [facility] office and  confirmed an 80% left internal carotid stenosis and mild flow reduction  on the right side.  Dr. Hart Rochester recommended that she undergo an elective  left carotid endarterectomy to reduce her risk for future strokes.   HOSPITAL COURSE:  Kelli [name] was electively admitted to [facility] on May 02, 2007, and underwent the previously mentioned  procedure.  Postoperatively she was extubated and neurologically intact  and after a short stay in the recovery unit was transferred to a  stepdown unit..  On postoperative day one, she remained hemodynamically  stable, although her beta blocker was held secondary to mild sinus  bradycardia with heart rate in the 50s.  This improved.  She also had  some decreased urine output and received a fluid bolus followed by IV  Lasix.  Her followup labs did show a resulting jump in her creatinine  from 1.1 to 1.51, and her preoperative creatinine was 1.18.  According  to the patient, her urine output remains adequate.  A followup basic  metabolic panel has been ordered and is still pending at the time of  dictation.  An order has also been written for her Foley catheter to be  removed as well.  Otherwise, Kelli [name] has remained hemodynamically  stable.  Her current vitals show a blood pressure of 115/49, temperature  98 to 99, oxygen saturations are 96% on 2 liters per nasal cannula.  We  will wean as tolerated.  Heart rate is in the 70s and in sinus rhythm.  Her other labs as of January  10th showed a sodium of 134, potassium 4.1,  chloride 101, CO2 28, blood glucose 134, BUN 14, creatinine 1.51.  Her  white blood count is 6.4, hemoglobin 11.4, hematocrit 33.4, platelet  count of 148.  Her preoperative liver function tests were within normal  limits.  Of note, she did report some hip pain during her  hospitalization and Colchicine p.r.n. was ordered on suspicion of a  possible gout.  We will consider further workup if symptoms do not  improve.  The patient does live alone and thus has remained  hospitalized, but a nursing home is expected to allow her time to  improve on her mobility.  We have spoken with family and they have  arranged for some assistance at home at least short term.  If she  continues to make progress in her mobility and there are no significant  changes in her status, we do anticipate that she will be ready for  discharge home on postoperative day  three, May 05, 2007.  She  currently remains in stable condition.  Uric acid level has been  ordered, but it is still pending.  We will also consider physical  therapy consult (inpatient or per Home Health) depending on how her  mobility progresses.   DISCHARGE MEDICATIONS:  1. Aciphex 20 mg daily.  2. Atenolol 100 mg daily.   1. Simvastatin 40 mg daily.  2. Synthroid 88 mcg daily.  3. Aspirin 81 mg daily.  4. Amlodipine 5 mg daily.  5. Tylox 1 to 2 tablets p.o. q.4-6h p.r.n. pain.   DISCHARGE INSTRUCTIONS:  She will continue a heart-healthy diet.  Activities are to be as tolerated.  She may shower and clean her  incisions with soap and water.  Avoid driving or heavy lifting for the  next two weeks.  She may ambulate with assistance as needed.  Contact or  call if she develops redness, drainage, or swelling from her incision  site, a fever greater than 101, new onset severe headache or  neurological changes.  She will see Dr. Hart [facility] at the [facility] office in  approximately two weeks, and our office will contact her regarding the  specific appointment date and time.      Jerold Coombe, P.A.      Quita Skye Hart Rochester, M.D.  Electronically Signed    AWZ/MEDQ  D:  05/04/2007  T:  05/04/2007  Job:  [id number]   cc:   Kingsley Callander. Ouida Sills, MD

## 2010-09-06 NOTE — Procedures (Signed)
CAROTID DUPLEX EXAM   INDICATION:  Follow up carotid artery disease.   HISTORY:  Diabetes:  No.  Cardiac:  Pacemaker.  Hypertension:  Yes.  Smoking:  No.  Previous Surgery:  Left carotid endarterectomy on 05/02/07 by Dr.  Hart Rochester.  CV History:  Asymptomatic.  Amaurosis Fugax No, Paresthesias No, Hemiparesis No.                                       RIGHT             LEFT  Brachial systolic pressure:         130               128  Brachial Doppler waveforms:         Normal            Normal  Vertebral direction of flow:        Antegrade         Antegrade  DUPLEX VELOCITIES (cm/sec)  CCA peak systolic                   69                94  ECA peak systolic                   114               94  ICA peak systolic                   94                94  ICA end diastolic                   26                32  PLAQUE MORPHOLOGY:                  Calcific  PLAQUE AMOUNT:                      Mild              None  PLAQUE LOCATION:                    ICA   IMPRESSION:  1. Doppler velocities suggest 20% to 39% stenosis in the right      internal carotid artery.  2. Patent left carotid post carotid endarterectomy with no evidence of      restenosis.  3. Antegrade flow in bilateral vertebral arteries.  No significant      changes from previous examinations.   ___________________________________________  V. Charlena Cross, MD   NT/MEDQ  D:  11/01/2009  T:  11/01/2009  Job:  [id number]

## 2010-09-06 NOTE — Procedures (Signed)
CAROTID DUPLEX EXAM   INDICATION:  Followup, left carotid endarterectomy.   HISTORY:  Diabetes:  No.  Cardiac:  No.  Hypertension:  Yes.  Smoking:  No.  Previous Surgery:  Left carotid endarterectomy.  CV History:  None.  Amaurosis Fugax No, Paresthesias No, Hemiparesis No.                                       RIGHT             LEFT  Brachial systolic pressure:                           130  Brachial Doppler waveforms:                           Biphasic  Vertebral direction of flow:        Antegrade         Antegrade  DUPLEX VELOCITIES (cm/sec)  CCA peak systolic                   62                92  ECA peak systolic                   127               157  ICA peak systolic                   84                79  ICA end diastolic                   19                18  PLAQUE MORPHOLOGY:                  Heterogenous      None  PLAQUE AMOUNT:                      Mild              None  PLAQUE LOCATION:                    ICA, ECA          None   IMPRESSION:  1. 20-39% stenosis noted in the right internal carotid artery.  2. Normal carotid duplex noted in the left internal carotid artery,      status post left carotid endarterectomy.  3. Antegrade bilateral vertebral arteries.   ___________________________________________  Quita Skye Hart Rochester, M.D.   MG/MEDQ  D:  11/12/2007  T:  11/12/2007  Job:  [id number]

## 2010-09-09 NOTE — Procedures (Signed)
   NAME:  Kelli [name], Kelli [name]                         ACCOUNT NO.:  [id number]   MEDICAL RECORD NO.:  [id number]                  PATIENT TYPE:  PREC   LOCATION:                                       FACILITY:   PHYSICIAN:  Kingsley Callander. Ouida Sills, M.D.                  DATE OF BIRTH:   DATE OF PROCEDURE:  08/26/2002  DATE OF DISCHARGE:                                    STRESS TEST   DESCRIPTION OF PROCEDURE:  The patient underwent an adenosine Cardiolite  stress test according to the protocol.  Adenosine was administered over four  minutes.  Cardiolite was injected at three minutes.  The patient experienced  mild shortness of breath.  There were no EKG changes suggestive of ischemia.  The patient on electrocardiogram revealed normal sinus rhythm at 60 beats  per minute.  There were nonspecific anterior T-wave changes.  The blood  pressures were consistently normal.  She experienced a maximum heart rate of  74.  Cardiolite images are pending.  She is being evaluated preoperatively  for shoulder replacement surgery.                                               Kingsley Callander. Ouida Sills, M.D.    ROF/MEDQ  D:  08/26/2002  T:  08/26/2002  Job:  [id number]

## 2010-09-09 NOTE — Group Therapy Note (Signed)
   NAME:  Kelli [name], Kelli [name]                         ACCOUNT NO.:  [id number]   MEDICAL RECORD NO.:  [id number]                   PATIENT TYPE:  INP   LOCATION:  A302                                 FACILITY:  [facility]   PHYSICIAN:  Angus G. Renard Matter, M.D.              DATE OF BIRTH:  1916-01-21   DATE OF PROCEDURE:  DATE OF DISCHARGE:                                   PROGRESS NOTE   The patient is still running a low grade fever.  This patient apparently had  a left shoulder replacement.  He does have a history of hypertension,  obstructive sleep apnea, anemia.   OBJECTIVE:  VITAL SIGNS:  Blood pressure 151/76.  Respirations 16.  Pulse  88.  Temperature 100.4.  HEART:  Regular rhythm.  LUNGS:  Clear to percussion and auscultation.  ABDOMEN:  No palpable organomegaly or masses.   ASSESSMENT:  The patient is being treated for and evaluated for low blood  pressures.  He apparently had left shoulder replacement two days prior to  this admission.  Her blood pressures have ranged from 151/76 to 188/76.   PLAN:  To continue current medications.  Will recheck CBC, UA.  P.r.n.  Tylenol.                                               Angus G. Renard Matter, M.D.    AGM/MEDQ  D:  10/12/2002  T:  10/13/2002  Job:  [id number]

## 2010-09-09 NOTE — H&P (Signed)
NAME:  Kelli [name], Kelli [name]                         ACCOUNT NO.:  [id number]   MEDICAL RECORD NO.:  [id number]                   PATIENT TYPE:  OBV   LOCATION:  A302                                 FACILITY:  [facility]   PHYSICIAN:  Kingsley Callander. Ouida Sills, M.D.                  DATE OF BIRTH:  Oct 18, 1915   DATE OF ADMISSION:  10/09/2002  DATE OF DISCHARGE:                                HISTORY & PHYSICAL   CHIEF COMPLAINT:  Weakness.   HISTORY OF PRESENT ILLNESS:  This patient is an 77 year old white female who  underwent a left shoulder replacement two days prior to admission.  She was  discharged after 24 hours and has experienced weakness and low blood  pressures at home.  Her son reports that the blood pressure was 80 systolic  the morning she left the hospital in [place].  He and his brother had  difficult times caring for Ms. Brownrigg at home.  She has felt weak on her  feet.  She has not experienced syncope.  She has had a moderate amount of  pain.  She is really unable to attend to her activities of daily living.  She was evaluated in the office and found to be orthostatic.  She has a  history of hypertension requiring four-drug treatment including Zestril,  atenolol, Lasix, and Norvasc.  She had a preoperative adenosine Cardiolite  stress test which revealed no ischemia.   PAST MEDICAL HISTORY:  1. Hypertension.  2. GERD.  3. DJD.  4. Hypothyroidism.  5. Obstructive sleep apnea.  6. Chronic renal failure.  7. Status post left knee replacement.  8. Status post right hip replacement.  9. Diverticulosis/diverticulitis.  10.      Hypercholesterolemia.  11.      Osteoporosis.  12.      Spinal stenosis.  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  14.      Lumbar disk surgery.   MEDICATIONS:  1. Zestril 40 mg daily.  2. Atenolol 100 mg daily.  3. Lasix 40 mg daily.  4. Norvasc 5 mg daily.  5. Aciphex 20 mg daily.  6. Synthroid 88 mcg daily.   ALLERGIES:  1. PENICILLIN.  2. SULFA.   FAMILY HISTORY:  Her father died of old age.  Her mother died with fluid  around her heart.  A brother died of emphysema.  A brother died of head and  neck cancer.  A sister died of lung cancer.   SOCIAL HISTORY:  She has not used tobacco, alcohol, or recreational drugs.   REVIEW OF SYSTEMS:  Noncontributory.   PHYSICAL EXAMINATION:  VITAL SIGNS:  Blood pressure drops from 114/52 to  94/60 standing.  Pulse 68, respirations 18.  GENERAL:  Pale but alert and oriented.  HEENT:  Eyes and oropharynx unremarkable.  NECK:  Supple with no JVD or thyromegaly.  LUNGS:  Clear.  HEART:  Regular with no murmurs.  ABDOMEN:  Nontender with no hepatosplenomegaly.  EXTREMITIES:  She has a left shoulder dressing and has her left arm in a  sling.  No peripheral edema.  NEUROLOGIC:  Grossly intact.   LABORATORY DATA:  White count 7.4, hemoglobin 10.6, platelets 200,000.  Sodium 134, potassium 4.5, BUN 30, creatinine 1.7, glucose 126.  SGOT 17,  albumin 3.   IMPRESSION:  1. Status post left shoulder replacement, now with weakness and hypotension     and orthostasis.  She has consumed very little in the way of liquids or     solids for two days.  She is being hospitalized for treatment with     intravenous fluids and monitoring of her blood pressure.  Her blood     pressure was in the 80s systolic at home.  We will hold her     antihypertensives for now.  2. Anemia.  We will start iron when she is feeling better.  3. Hypothyroidism.  Continue Synthroid.  4. Gastroesophageal reflux disease.  Continue Protonix.  5. Sleep apnea.  6. Osteoporosis.  7. Hypercholesterolemia.                                               Kingsley Callander. Ouida Sills, M.D.    ROF/MEDQ  D:  10/10/2002  T:  10/10/2002  Job:  [id number]

## 2010-09-09 NOTE — Discharge Summary (Signed)
NAME:  Kelli [name], Kelli [name]                         ACCOUNT NO.:  [id number]   MEDICAL RECORD NO.:  [id number]                   PATIENT TYPE:  INP   LOCATION:  A302                                 FACILITY:  [facility]   PHYSICIAN:  Kingsley Callander. Ouida Sills, M.D.                  DATE OF BIRTH:  1915-11-27   DATE OF ADMISSION:  10/09/2002  DATE OF DISCHARGE:  10/14/2002                                 DISCHARGE SUMMARY   DISCHARGE DIAGNOSES:  1. Hypotension, dehydration.  2. Status post left shoulder replacement.  3. Hypertension.  4. Gastroesophageal reflux disease.  5. Osteoarthritis.  6. Hypothyroidism.  7. Obstructive sleep apnea.  8. Chronic renal failure.  9. Osteoporosis.  10.      Hypercholesterolemia.  11.      Postoperative anemia.   DISCHARGE MEDICATIONS:  1. Norvasc 5 mg daily.  2. Lisinopril 40 mg daily.  3. Atenolol 100 mg daily.  4. Lasix 40 mg daily.  5. Niferex 150 mg b.i.d.  6. Synthroid 88 mcg daily.  7. Protonix 40 mg daily.  8. Detrol LA 4 mg daily.  9. Tylenol 1000 mg q.4h. p.r.n.  10.      Tylox one q.4h. p.r.n.  11.      Milk of magnesia 30 mL daily p.r.n.   HOSPITAL COURSE:  This patient is an 77 year old female who was two days  status post left shoulder replacement when she presented to the office with  low blood pressures in the 80s at home with generalized weakness.  Her  family was unable to care for her there.  Her white count was 7.4 with a  hemoglobin of 10.6.  Her BUN was 30 with a creatinine of 1.7.  She was  hospitalized and treated with IV fluids.  Her antihypertensives were held.  She experienced a low-grade fever which resolved.  She was mildly anemic  with an initial hemoglobin of 10.6 and a repeat of 9.4 with an MCV of 91.   She was treated with incentive spirometry.  Her urinalysis revealed no  evidence of UTI.   She was treated with physical therapy.  She was anticoagulated with subcu  Lovenox.   Her weakness improved, her mobility improved.   Arrangements were made for  transfer to a nursing facility for additional rehab.   Niferex was added for treatment of her anemia.   She will have follow-up with her orthopedist in two weeks.  She will be  followed up regularly at the nursing home.   She experienced low oxygen saturations at night and was treated with  supplemental oxygen at night.  She has a history of sleep apnea but has not  tolerated CPAP.   Her hypothyroidism was stable; her TSH was 0.59.  Kingsley Callander. Ouida Sills, M.D.    ROF/MEDQ  D:  10/14/2002  T:  10/14/2002  Job:  [id number]

## 2010-10-06 ENCOUNTER — Telehealth: Payer: Self-pay | Admitting: *Deleted

## 2010-10-06 MED ORDER — CLINDAMYCIN HCL 300 MG PO CAPS: ORAL_CAPSULE | ORAL | Status: DC

## 2010-10-06 NOTE — Telephone Encounter (Signed)
I spoke with pt and gave new order for clindamycin for dental work Also spoke with Dr. Maximino Greenland, DDS of [place]  Office with antibiotic orders as well New rx to drugstore.

## 2010-10-31 ENCOUNTER — Other Ambulatory Visit: Payer: Self-pay

## 2010-10-31 ENCOUNTER — Ambulatory Visit: Payer: Self-pay | Admitting: Surgery

## 2010-11-17 ENCOUNTER — Encounter: Payer: Medicare Other | Admitting: *Deleted

## 2010-11-22 ENCOUNTER — Encounter: Payer: Self-pay | Admitting: *Deleted

## 2010-11-23 ENCOUNTER — Ambulatory Visit: Payer: Medicare Other

## 2010-11-23 ENCOUNTER — Other Ambulatory Visit (INDEPENDENT_AMBULATORY_CARE_PROVIDER_SITE_OTHER): Payer: Medicare Other

## 2010-11-23 DIAGNOSIS — I6529 Occlusion and stenosis of unspecified carotid artery: Secondary | ICD-10-CM

## 2010-11-23 DIAGNOSIS — Z48812 Encounter for surgical aftercare following surgery on the circulatory system: Secondary | ICD-10-CM

## 2010-11-30 NOTE — Procedures (Unsigned)
CAROTID DUPLEX EXAM  INDICATION:  Follow up left CEA.  HISTORY: Diabetes:  No. Cardiac:  No. Hypertension:  Yes. Smoking:  No. Previous Surgery:  Left CEA performed on 05/02/2007. CV History: Amaurosis Fugax No, Paresthesias No, Hemiparesis No.                                      RIGHT             LEFT Brachial systolic pressure:         120               120 Brachial Doppler waveforms:         Normal            Normal Vertebral direction of flow:        Antegrade         Antegrade DUPLEX VELOCITIES (cm/sec) CCA peak systolic                   57                68 ECA peak systolic                   88                70 ICA peak systolic                   87                30 ICA end diastolic                   17                9 PLAQUE MORPHOLOGY:                  Heterogenous PLAQUE AMOUNT:                      Mild              None PLAQUE LOCATION:                    ICA/CCA  IMPRESSION: 1. Widely patent left carotid endarterectomy without evidence of     restenosis or hyperplasia. 2. 1% to 39% right internal carotid artery plaquing. 3. Bilateral vertebral arteries are within normal limits. 4. Stable results compared to prior study of 11/01/2009.  ___________________________________________ Quita Skye. Hart Rochester, M.D.  LT/MEDQ  D:  11/23/2010  T:  11/23/2010  Job:  [id number]

## 2010-12-07 ENCOUNTER — Telehealth: Payer: Self-pay | Admitting: Internal Medicine

## 2010-12-07 NOTE — Telephone Encounter (Signed)
Pt son calling regarding needing help to set up to send a remote device check. Please return call.   Pt cell # is  [phone]

## 2010-12-07 NOTE — Telephone Encounter (Signed)
Instructions for [facility] transmission reviewed with Kelli [name].  He will send 12/08/10.

## 2010-12-09 ENCOUNTER — Ambulatory Visit (INDEPENDENT_AMBULATORY_CARE_PROVIDER_SITE_OTHER): Payer: Medicare Other | Admitting: *Deleted

## 2010-12-09 ENCOUNTER — Other Ambulatory Visit: Payer: Self-pay | Admitting: Internal Medicine

## 2010-12-09 DIAGNOSIS — Z950 Presence of cardiac pacemaker: Secondary | ICD-10-CM

## 2010-12-09 DIAGNOSIS — I471 Supraventricular tachycardia: Secondary | ICD-10-CM

## 2010-12-09 LAB — REMOTE PACEMAKER DEVICE
ATRIAL PACING PM: 8
BAMS-0001: 175 {beats}/min
BATTERY VOLTAGE: 2.8 V
RV LEAD IMPEDENCE PM: 594 Ohm
RV LEAD THRESHOLD: 0.5 V
VENTRICULAR PACING PM: 17

## 2010-12-16 NOTE — Progress Notes (Signed)
Pacer remote check  

## 2011-01-04 ENCOUNTER — Encounter: Payer: Self-pay | Admitting: *Deleted

## 2011-01-09 ENCOUNTER — Ambulatory Visit: Payer: Medicare Other | Admitting: Surgery

## 2011-01-12 LAB — BASIC METABOLIC PANEL
CO2: 28
CO2: 28
CO2: 32
Calcium: 8.6
Chloride: 101
Chloride: 104
Chloride: 109
Creatinine, Ser: 1.1
Creatinine, Ser: 1.51 — ABNORMAL HIGH
GFR calc Af Amer: 39 — ABNORMAL LOW
GFR calc Af Amer: 52 — ABNORMAL LOW
GFR calc Af Amer: 57 — ABNORMAL LOW
Glucose, Bld: 134 — ABNORMAL HIGH
Glucose, Bld: 139 — ABNORMAL HIGH
Sodium: 134 — ABNORMAL LOW
Sodium: 140
Sodium: 140

## 2011-01-12 LAB — COMPREHENSIVE METABOLIC PANEL
ALT: 11
Alkaline Phosphatase: 80
BUN: 15
CO2: 25
Chloride: 106
GFR calc non Af Amer: 43 — ABNORMAL LOW
Glucose, Bld: 110 — ABNORMAL HIGH
Potassium: 4.1
Sodium: 138
Total Bilirubin: 0.5
Total Protein: 6.8

## 2011-01-12 LAB — CBC
Hemoglobin: 11.4 — ABNORMAL LOW
MCHC: 34
MCHC: 34.1
MCV: 92.3
RBC: 3.62 — ABNORMAL LOW
RDW: 13.8
WBC: 6.4

## 2011-01-12 LAB — TYPE AND SCREEN
ABO/RH(D): O POS
Antibody Screen: NEGATIVE

## 2011-01-12 LAB — URINE MICROSCOPIC-ADD ON

## 2011-01-12 LAB — URINALYSIS, ROUTINE W REFLEX MICROSCOPIC
Bilirubin Urine: NEGATIVE
Glucose, UA: NEGATIVE
Hgb urine dipstick: NEGATIVE
Ketones, ur: NEGATIVE
Protein, ur: NEGATIVE
Urobilinogen, UA: 0.2

## 2011-01-12 LAB — PROTIME-INR
INR: 0.9
Prothrombin Time: 12.4

## 2011-03-17 ENCOUNTER — Encounter: Payer: Medicare Other | Admitting: *Deleted

## 2011-03-18 ENCOUNTER — Encounter: Payer: Self-pay | Admitting: *Deleted

## 2011-03-27 ENCOUNTER — Other Ambulatory Visit: Payer: Self-pay | Admitting: Internal Medicine

## 2011-03-27 ENCOUNTER — Encounter: Payer: Self-pay | Admitting: Internal Medicine

## 2011-03-27 ENCOUNTER — Ambulatory Visit (INDEPENDENT_AMBULATORY_CARE_PROVIDER_SITE_OTHER): Payer: Medicare Other | Admitting: *Deleted

## 2011-03-27 DIAGNOSIS — I441 Atrioventricular block, second degree: Secondary | ICD-10-CM

## 2011-03-27 DIAGNOSIS — I471 Supraventricular tachycardia: Secondary | ICD-10-CM

## 2011-03-27 DIAGNOSIS — Z950 Presence of cardiac pacemaker: Secondary | ICD-10-CM

## 2011-03-29 LAB — REMOTE PACEMAKER DEVICE
AL THRESHOLD: 0.5 V
ATRIAL PACING PM: 6
BAMS-0001: 175 {beats}/min
RV LEAD THRESHOLD: 0.5 V
VENTRICULAR PACING PM: 58

## 2011-03-29 NOTE — Progress Notes (Signed)
Remote pacer check  

## 2011-04-26 ENCOUNTER — Encounter: Payer: Self-pay | Admitting: *Deleted

## 2011-07-10 ENCOUNTER — Telehealth: Payer: Self-pay | Admitting: Internal Medicine

## 2011-07-10 NOTE — Telephone Encounter (Signed)
07-10-11 called pt to set up pacer check with taylor, pt asked that I call her son to set up appt, lmm @ 424pm for him to call to make appt either here or [facility] and gave him both numbers/mt

## 2011-07-17 ENCOUNTER — Encounter: Payer: Self-pay | Admitting: Internal Medicine

## 2011-09-12 ENCOUNTER — Encounter: Payer: Self-pay | Admitting: Internal Medicine

## 2011-09-12 ENCOUNTER — Ambulatory Visit (INDEPENDENT_AMBULATORY_CARE_PROVIDER_SITE_OTHER): Payer: Medicare Other | Admitting: Internal Medicine

## 2011-09-12 VITALS — BP 124/70 | HR 68 | Ht 62.0 in | Wt 162.8 lb

## 2011-09-12 DIAGNOSIS — I10 Essential (primary) hypertension: Secondary | ICD-10-CM

## 2011-09-12 DIAGNOSIS — Z950 Presence of cardiac pacemaker: Secondary | ICD-10-CM

## 2011-09-12 DIAGNOSIS — I471 Supraventricular tachycardia, unspecified: Secondary | ICD-10-CM

## 2011-09-12 NOTE — Assessment & Plan Note (Signed)
Her device is working. We'll plan to recheck in one year.

## 2011-09-12 NOTE — Assessment & Plan Note (Signed)
Her blood pressure is well controlled. She will continue her current medical therapy and maintain a low-sodium diet. 

## 2011-09-12 NOTE — Patient Instructions (Signed)
Remote monitoring is used to monitor your Pacemaker of ICD from home. This monitoring reduces the number of office visits required to check your device to one time per year. It allows [place] to keep an eye on the functioning of your device to ensure it is working properly. You are scheduled for a device check from home on December 14, 2011. You may send your transmission at any time that day. If you have a wireless device, the transmission will be sent automatically. After your physician reviews your transmission, you will receive a postcard with your next transmission date.  Your physician wants you to follow-up in: 1 year with Dr Ladona Ridgel.  You will receive a reminder letter in the mail two months in advance. If you don't receive a letter, please call our office to schedule the follow-up appointment.

## 2011-09-12 NOTE — Progress Notes (Signed)
HPI Kelli [name] returns today for followup. She is a 78 year old woman with a history of symptomatic bradycardia, status post permanent pacemaker insertion. Since we saw her last, she has done well. She denies chest pain, shortness of breath, or swelling in her lower legs. Her appetite is good. Her memory is good. No passing out spells. Allergies  Allergen Reactions  . Penicillins   . Sulfonamide Derivatives      Current Outpatient Prescriptions  Medication Sig Dispense Refill  . amLODipine (NORVASC) 10 MG tablet Take 10 mg by mouth daily.        Marland Kitchen aspirin 81 MG tablet Take 81 mg by mouth daily.        . Levothyroxine Sodium 88 MCG CAPS Take 1 capsule by mouth daily.        Marland Kitchen lisinopril (PRINIVIL,ZESTRIL) 20 MG tablet Take 20 mg by mouth daily.        . metoprolol succinate (TOPROL-XL) 25 MG 24 hr tablet Take 25 mg by mouth daily.      . RABEprazole (ACIPHEX) 20 MG tablet Take 20 mg by mouth daily.         Past Medical History  Diagnosis Date  . Vasovagal syncope   . Hypertension   . Pacemaker   . Carotid artery disease   . Hypothyroidism   . GERD (gastroesophageal reflux disease)   . CHF (congestive heart failure)     diastolic in nature  . Sleep apnea     ROS:   All systems reviewed and negative except as noted in the HPI.   Past Surgical History  Procedure Date  . Pacemaker insertion   . Back surgery      No family history on file.   History   Social History  . Marital Status: Widowed    Spouse Name: N/A    Number of Children: N/A  . Years of Education: N/A   Occupational History  . Not on file.   Social History Main Topics  . Smoking status: Never Smoker   . Smokeless tobacco: Never Used  . Alcohol Use: No  . Drug Use: No  . Sexually Active: Not on file   Other Topics Concern  . Not on file   Social History Narrative  . No narrative on file     BP 124/70  Pulse 68  Ht 5\' 2"  (1.575 m)  Wt 162 lb 12.8 oz (73.846 kg)  BMI 29.78  kg/m2  Physical Exam:  Well appearing elderly woman, NAD HEENT: Unremarkable Neck:  No JVD, no thyromegally Lungs:  Clear with no wheezes, rales, or rhonchi HEART:  Regular rate rhythm, no murmurs, no rubs, no clicks Abd:  soft, positive bowel sounds, no organomegally, no rebound, no guarding Ext:  2 plus pulses, no edema, no cyanosis, no clubbing Skin:  No rashes no nodules Neuro:  CN II through XII intact, motor grossly intact  DEVICE  Normal device function.  See PaceArt for details.   Assess/Plan:

## 2011-09-12 NOTE — Assessment & Plan Note (Signed)
She has had no recurrent symptomatic arrhythmias. She will undergo watchful waiting.

## 2011-09-13 LAB — PACEMAKER DEVICE OBSERVATION
AL AMPLITUDE: 2.8 mv
AL IMPEDENCE PM: 469 Ohm
AL THRESHOLD: 1.5 V
ATRIAL PACING PM: 29.1
RV LEAD AMPLITUDE: 11.2 mv
RV LEAD IMPEDENCE PM: 620 Ohm

## 2011-12-05 ENCOUNTER — Encounter: Payer: Self-pay | Admitting: Neurosurgery

## 2011-12-06 ENCOUNTER — Ambulatory Visit: Payer: Medicare Other | Admitting: Neurosurgery

## 2011-12-06 ENCOUNTER — Other Ambulatory Visit: Payer: Medicare Other

## 2011-12-21 ENCOUNTER — Encounter: Payer: Medicare Other | Admitting: *Deleted

## 2011-12-26 ENCOUNTER — Other Ambulatory Visit: Payer: Self-pay | Admitting: *Deleted

## 2011-12-26 DIAGNOSIS — Z48812 Encounter for surgical aftercare following surgery on the circulatory system: Secondary | ICD-10-CM

## 2011-12-26 DIAGNOSIS — I6529 Occlusion and stenosis of unspecified carotid artery: Secondary | ICD-10-CM

## 2011-12-28 ENCOUNTER — Encounter: Payer: Self-pay | Admitting: *Deleted

## 2011-12-29 ENCOUNTER — Encounter: Payer: Self-pay | Admitting: Neurosurgery

## 2012-01-01 ENCOUNTER — Ambulatory Visit (INDEPENDENT_AMBULATORY_CARE_PROVIDER_SITE_OTHER): Payer: Medicare Other | Admitting: Neurosurgery

## 2012-01-01 ENCOUNTER — Encounter: Payer: Self-pay | Admitting: Neurosurgery

## 2012-01-01 ENCOUNTER — Other Ambulatory Visit (INDEPENDENT_AMBULATORY_CARE_PROVIDER_SITE_OTHER): Payer: Medicare Other | Admitting: *Deleted

## 2012-01-01 VITALS — BP 152/65 | HR 61 | Temp 98.0°F | Ht 62.0 in | Wt 164.0 lb

## 2012-01-01 DIAGNOSIS — I6529 Occlusion and stenosis of unspecified carotid artery: Secondary | ICD-10-CM | POA: Insufficient documentation

## 2012-01-01 DIAGNOSIS — Z48812 Encounter for surgical aftercare following surgery on the circulatory system: Secondary | ICD-10-CM

## 2012-01-01 NOTE — Progress Notes (Signed)
[facility] Carotid Office Note  CC: Annual carotid surveillance Referring Physician: Hart Rochester  History of Present Illness: 78 year old female patient of Dr. Hart Rochester who status post left CEA in 2009. The patient denies any signs or symptoms of stroke, TIA, amaurosis fugax or any neural deficit. The patient and her son states she is doing very well, eats well and sleeps well without any difficulty.  Past Medical History  Diagnosis Date  . Vasovagal syncope   . Hypertension   . Pacemaker   . Carotid artery disease   . Hypothyroidism   . GERD (gastroesophageal reflux disease)   . CHF (congestive heart failure)     diastolic in nature  . Sleep apnea     ROS: [x]  Positive   [ ]  Denies    General: [ ]  Weight loss, [ ]  Fever, [ ]  chills Neurologic: [ ]  Dizziness, [ ]  Blackouts, [ ]  Seizure [ ]  Stroke, [ ]  "Mini stroke", [ ]  Slurred speech, [ ]  Temporary blindness; [ ]  weakness in arms or legs, [ ]  Hoarseness Cardiac: [ ]  Chest pain/pressure, [ ]  Shortness of breath at rest [ ]  Shortness of breath with exertion, [ ]  Atrial fibrillation or irregular heartbeat Vascular: [ ]  Pain in legs with walking, [ ]  Pain in legs at rest, [ ]  Pain in legs at night,  [ ]  Non-healing ulcer, [ ]  Blood clot in vein/DVT,   Pulmonary: [ ]  Home oxygen, [ ]  Productive cough, [ ]  Coughing up blood, [ ]  Asthma,  [ ]  Wheezing Musculoskeletal:  [ ]  Arthritis, [ ]  Low back pain, [ ]  Joint pain Hematologic: [ ]  Easy Bruising, [ ]  Anemia; [ ]  Hepatitis Gastrointestinal: [ ]  Blood in stool, [ ]  Gastroesophageal Reflux/heartburn, [ ]  Trouble swallowing Urinary: [ ]  chronic Kidney disease, [ ]  on HD - [ ]  MWF or [ ]  TTHS, [ ]  Burning with urination, [ ]  Difficulty urinating Skin: [ ]  Rashes, [ ]  Wounds Psychological: [ ]  Anxiety, [ ]  Depression   Social History History  Substance Use Topics  . Smoking status: Never Smoker   . Smokeless tobacco: Never Used  . Alcohol Use: No    Family  History History reviewed. No pertinent family history.  Allergies  Allergen Reactions  . Penicillins   . Sulfonamide Derivatives     Current Outpatient Prescriptions  Medication Sig Dispense Refill  . amLODipine (NORVASC) 10 MG tablet Take 10 mg by mouth daily.        Marland Kitchen aspirin 81 MG tablet Take 81 mg by mouth daily.        . Levothyroxine Sodium 88 MCG CAPS Take 1 capsule by mouth daily.        Marland Kitchen lisinopril (PRINIVIL,ZESTRIL) 20 MG tablet Take 20 mg by mouth daily.        . metoprolol succinate (TOPROL-XL) 25 MG 24 hr tablet Take 25 mg by mouth daily.      . RABEprazole (ACIPHEX) 20 MG tablet Take 20 mg by mouth daily.        Physical Examination  Filed Vitals:   01/01/12 1512  BP: 152/65  Pulse: 61  Temp:     Body mass index is 30.00 kg/(m^2).  General:  WDWN in NAD Gait: Normal HEENT: WNL Eyes: Pupils equal Pulmonary: normal non-labored breathing , without Rales, rhonchi,  wheezing Cardiac: RRR, without  Murmurs, rubs or gallops; Abdomen: soft, NT, no masses Skin: no rashes, ulcers noted  Vascular Exam Pulses: 2+ radial  pulses bilaterally Carotid bruits: Carotid pulses to auscultation no bruits are heard Extremities without ischemic changes, no Gangrene , no cellulitis; no open wounds;  Musculoskeletal: no muscle wasting or atrophy   Neurologic: A&O X 3; Appropriate Affect ; SENSATION: normal; MOTOR FUNCTION:  moving all extremities equally. Speech is fluent/normal  Non-Invasive Vascular Imaging CAROTID DUPLEX 01/01/2012  Right ICA 20 - 39 % stenosis Left ICA 0 - 19% stenosis   ASSESSMENT/PLAN: Asymptomatic patient 5 years status post left CEA and doing well. The patient will followup in one year with repeat carotid duplex. The patient's questions were encouraged and answered, she is in agreement with this plan.  Lauree Chandler ANP   Clinic MD: Hart Rochester

## 2012-01-09 ENCOUNTER — Encounter: Payer: Self-pay | Admitting: Internal Medicine

## 2012-01-09 ENCOUNTER — Ambulatory Visit (INDEPENDENT_AMBULATORY_CARE_PROVIDER_SITE_OTHER): Payer: Medicare Other | Admitting: *Deleted

## 2012-01-09 DIAGNOSIS — I441 Atrioventricular block, second degree: Secondary | ICD-10-CM

## 2012-01-09 DIAGNOSIS — Z950 Presence of cardiac pacemaker: Secondary | ICD-10-CM

## 2012-01-10 LAB — REMOTE PACEMAKER DEVICE
ATRIAL PACING PM: 75
BAMS-0001: 175 {beats}/min
BATTERY VOLTAGE: 2.8 V
RV LEAD AMPLITUDE: 11.2 mv

## 2012-01-17 ENCOUNTER — Encounter: Payer: Self-pay | Admitting: *Deleted

## 2012-03-08 ENCOUNTER — Other Ambulatory Visit: Payer: Self-pay | Admitting: *Deleted

## 2012-03-08 DIAGNOSIS — Z48812 Encounter for surgical aftercare following surgery on the circulatory system: Secondary | ICD-10-CM

## 2012-03-08 DIAGNOSIS — I6529 Occlusion and stenosis of unspecified carotid artery: Secondary | ICD-10-CM

## 2012-04-15 ENCOUNTER — Encounter: Payer: Medicare Other | Admitting: *Deleted

## 2012-04-23 ENCOUNTER — Encounter: Payer: Self-pay | Admitting: *Deleted

## 2012-05-24 ENCOUNTER — Encounter: Payer: Self-pay | Admitting: *Deleted

## 2012-05-31 ENCOUNTER — Other Ambulatory Visit: Payer: Self-pay | Admitting: Internal Medicine

## 2012-05-31 ENCOUNTER — Ambulatory Visit (INDEPENDENT_AMBULATORY_CARE_PROVIDER_SITE_OTHER): Payer: Medicare Other | Admitting: *Deleted

## 2012-05-31 DIAGNOSIS — I441 Atrioventricular block, second degree: Secondary | ICD-10-CM

## 2012-05-31 DIAGNOSIS — Z950 Presence of cardiac pacemaker: Secondary | ICD-10-CM

## 2012-06-03 LAB — REMOTE PACEMAKER DEVICE
AL AMPLITUDE: 2.8 mv
BATTERY VOLTAGE: 2.8 V
RV LEAD IMPEDENCE PM: 622 Ohm
RV LEAD THRESHOLD: 0.5 V
VENTRICULAR PACING PM: 8

## 2012-06-26 ENCOUNTER — Encounter: Payer: Self-pay | Admitting: *Deleted

## 2012-07-01 ENCOUNTER — Encounter: Payer: Self-pay | Admitting: Neurosurgery

## 2012-07-02 ENCOUNTER — Other Ambulatory Visit (INDEPENDENT_AMBULATORY_CARE_PROVIDER_SITE_OTHER): Payer: Medicare Other | Admitting: Vascular Surgery

## 2012-07-02 ENCOUNTER — Ambulatory Visit: Payer: Medicare Other | Admitting: Neurosurgery

## 2012-07-03 ENCOUNTER — Encounter: Payer: Self-pay | Admitting: Internal Medicine

## 2012-07-09 ENCOUNTER — Other Ambulatory Visit: Payer: Self-pay

## 2012-07-09 DIAGNOSIS — Z48812 Encounter for surgical aftercare following surgery on the circulatory system: Secondary | ICD-10-CM

## 2012-07-10 ENCOUNTER — Encounter: Payer: Self-pay | Admitting: Vascular Surgery

## 2012-08-02 ENCOUNTER — Encounter: Payer: Self-pay | Admitting: Internal Medicine

## 2012-09-11 ENCOUNTER — Encounter: Payer: Medicare Other | Admitting: Internal Medicine

## 2012-09-24 ENCOUNTER — Ambulatory Visit (INDEPENDENT_AMBULATORY_CARE_PROVIDER_SITE_OTHER): Payer: Medicare Other | Admitting: Internal Medicine

## 2012-09-24 ENCOUNTER — Encounter: Payer: Self-pay | Admitting: Internal Medicine

## 2012-09-24 VITALS — BP 162/73 | HR 67 | Wt 168.0 lb

## 2012-09-24 DIAGNOSIS — I471 Supraventricular tachycardia: Secondary | ICD-10-CM

## 2012-09-24 DIAGNOSIS — Z950 Presence of cardiac pacemaker: Secondary | ICD-10-CM

## 2012-09-24 DIAGNOSIS — I10 Essential (primary) hypertension: Secondary | ICD-10-CM

## 2012-09-24 LAB — PACEMAKER DEVICE OBSERVATION
ATRIAL PACING PM: 71
BAMS-0001: 175 {beats}/min
RV LEAD THRESHOLD: 0.5 V
VENTRICULAR PACING PM: 11

## 2012-09-24 NOTE — Progress Notes (Signed)
HPI Kelli [name] returns today for followup. She is a pleasant 79 yo woman with a h/o symptomatic bradycardia, s/p PPM, HTN, and diastolic CHF. In the interim she has done well. Her son who is with her today tells be she still fishes on a regular basis. No syncope. No peripheral edema. No falls. Allergies  Allergen Reactions  . Penicillins   . Sulfonamide Derivatives      Current Outpatient Prescriptions  Medication Sig Dispense Refill  . amLODipine (NORVASC) 10 MG tablet Take 10 mg by mouth daily.        Marland Kitchen aspirin 81 MG tablet Take 81 mg by mouth daily.        . Levothyroxine Sodium 88 MCG CAPS Take 1 capsule by mouth daily.        Marland Kitchen lisinopril (PRINIVIL,ZESTRIL) 20 MG tablet Take 20 mg by mouth daily.        . metoprolol succinate (TOPROL-XL) 25 MG 24 hr tablet Take 25 mg by mouth daily.      . RABEprazole (ACIPHEX) 20 MG tablet Take 20 mg by mouth daily.       No current facility-administered medications for this visit.     Past Medical History  Diagnosis Date  . Vasovagal syncope   . Hypertension   . Pacemaker   . Carotid artery disease   . Hypothyroidism   . GERD (gastroesophageal reflux disease)   . CHF (congestive heart failure)     diastolic in nature  . Sleep apnea     ROS:   All systems reviewed and negative except as noted in the HPI.   Past Surgical History  Procedure Laterality Date  . Pacemaker insertion    . Back surgery       No family history on file.   History   Social History  . Marital Status: Widowed    Spouse Name: N/A    Number of Children: N/A  . Years of Education: N/A   Occupational History  . Not on file.   Social History Main Topics  . Smoking status: Never Smoker   . Smokeless tobacco: Never Used  . Alcohol Use: No  . Drug Use: No  . Sexually Active: Not on file   Other Topics Concern  . Not on file   Social History Narrative  . No narrative on file     BP 162/73  Pulse 67  Wt 168 lb (76.204 kg)  BMI 30.72  kg/m2  Physical Exam:  Well appearing elderly woman, NAD HEENT: Unremarkable Neck:  No JVD, no thyromegally Back:  No CVA tenderness Lungs:  Clear with no wheezes, rales, or rhonchi HEART:  Regular rate rhythm, no murmurs, no rubs, no clicks Abd:  soft, positive bowel sounds, no organomegally, no rebound, no guarding Ext:  2 plus pulses, no edema, no cyanosis, no clubbing Skin:  No rashes no nodules Neuro:  CN II through XII intact, motor grossly intact  DEVICE  Normal device function.  See PaceArt for details.   Assess/Plan:

## 2012-09-24 NOTE — Patient Instructions (Addendum)
Your physician wants you to follow-up in: 12 months with Dr Court Joy will receive a reminder letter in the mail two months in advance. If you don't receive a letter, please call our office to schedule the follow-up appointment.    Remote monitoring is used to monitor your Pacemaker of ICD from home. This monitoring reduces the number of office visits required to check your device to one time per year. It allows [place] to keep an eye on the functioning of your device to ensure it is working properly. You are scheduled for a device check from home on 12/30/12. You may send your transmission at any time that day. If you have a wireless device, the transmission will be sent automatically. After your physician reviews your transmission, you will receive a postcard with your next transmission date.

## 2012-09-24 NOTE — Assessment & Plan Note (Signed)
Her Medtronic DDD PPM is working normally and has approximately 11 years of battery longevity. Will follow.

## 2012-09-24 NOTE — Assessment & Plan Note (Signed)
Her blood pressure today is elevated. I have asked her to reduce her sodium intake. She will follow up with her primary MD Dr. Leonel Ramsay for additional titration.

## 2012-12-07 ENCOUNTER — Encounter (HOSPITAL_COMMUNITY): Payer: Self-pay | Admitting: Emergency Medicine

## 2012-12-07 ENCOUNTER — Emergency Department (HOSPITAL_COMMUNITY)
Admission: EM | Admit: 2012-12-07 | Discharge: 2012-12-08 | Disposition: A | Discharge status: Home / Self Care | Payer: Medicare Other | Attending: Emergency Medicine | Admitting: Emergency Medicine

## 2012-12-07 DIAGNOSIS — I509 Heart failure, unspecified: Secondary | ICD-10-CM | POA: Insufficient documentation

## 2012-12-07 DIAGNOSIS — N289 Disorder of kidney and ureter, unspecified: Secondary | ICD-10-CM | POA: Insufficient documentation

## 2012-12-07 DIAGNOSIS — Z950 Presence of cardiac pacemaker: Secondary | ICD-10-CM | POA: Insufficient documentation

## 2012-12-07 DIAGNOSIS — Z79899 Other long term (current) drug therapy: Secondary | ICD-10-CM | POA: Insufficient documentation

## 2012-12-07 DIAGNOSIS — E860 Dehydration: Secondary | ICD-10-CM

## 2012-12-07 DIAGNOSIS — Z8679 Personal history of other diseases of the circulatory system: Secondary | ICD-10-CM | POA: Insufficient documentation

## 2012-12-07 DIAGNOSIS — I10 Essential (primary) hypertension: Secondary | ICD-10-CM | POA: Insufficient documentation

## 2012-12-07 DIAGNOSIS — E039 Hypothyroidism, unspecified: Secondary | ICD-10-CM | POA: Insufficient documentation

## 2012-12-07 DIAGNOSIS — K5900 Constipation, unspecified: Secondary | ICD-10-CM | POA: Insufficient documentation

## 2012-12-07 DIAGNOSIS — Z7982 Long term (current) use of aspirin: Secondary | ICD-10-CM | POA: Insufficient documentation

## 2012-12-07 NOTE — ED Notes (Signed)
Per pt, fell sleep when watching tv and awoke "feeling strange; was not able to think clearly." Pt denies cp, sob, or any other symptoms. Pt states that she thought it was her oxygen so borrowed her neighbors oxygen tank. Pt presents to ED with neighbors oxygen tank on 3 lpm on [place]. Pt also reports to wearing CPAP at night for sleep apnea but was not wearing CPAP when she fell asleep during this episode. On arrival, pt O2 sat on RA was 98%.

## 2012-12-07 NOTE — ED Provider Notes (Signed)
Scribed for Vida Roller, MD, the patient was seen in room APA05/APA05. This chart was scribed by Lewanda Rife, ED scribe. Patient's care was started at 2349  CSN: [id number]     Arrival date & time 12/07/12  2300 History     First MD Initiated Contact with Patient 12/07/12 2348     Chief Complaint  Patient presents with  . Anxiety   (Consider location/radiation/quality/duration/timing/severity/associated sxs/prior Treatment) The history is provided by the patient and a relative.   HPI Comments: Kelli [name] is a 79 y.o. female who presents to the Emergency Department complaining of "weird unexplainable feeling" onset PTA after falling asleep while watching TV woke up not "feeling like herself and not thinking straight". Reports associated constipation. Denies associated pain, shortness of breath, nausea, chest pain, leg swelling, and urinary symptoms. Denies any aggravating or alleviating factors. Reports hx of sleep apnea.  Past Medical History  Diagnosis Date  . Vasovagal syncope   . Hypertension   . Pacemaker   . Carotid artery disease   . Hypothyroidism   . GERD (gastroesophageal reflux disease)   . CHF (congestive heart failure)     diastolic in nature  . Sleep apnea    Past Surgical History  Procedure Laterality Date  . Pacemaker insertion    . Back surgery     No family history on file. History  Substance Use Topics  . Smoking status: Never Smoker   . Smokeless tobacco: Never Used  . Alcohol Use: No   OB History   Grav Para Term Preterm Abortions TAB SAB Ect Mult Living                 Review of Systems  Psychiatric/Behavioral: The patient is nervous/anxious.    A complete 10 system review of systems was obtained and all systems are negative except as noted in the HPI and PMH.    Allergies  Penicillins and Sulfonamide derivatives  Home Medications   Current Outpatient Rx  Name  Route  Sig  Dispense  Refill  . amLODipine (NORVASC) 10 MG  tablet   Oral   Take 10 mg by mouth daily.           Marland Kitchen aspirin 81 MG tablet   Oral   Take 81 mg by mouth daily.           . Levothyroxine Sodium 88 MCG CAPS   Oral   Take 1 capsule by mouth daily.           Marland Kitchen lisinopril (PRINIVIL,ZESTRIL) 20 MG tablet   Oral   Take 20 mg by mouth daily.           . metoprolol succinate (TOPROL-XL) 25 MG 24 hr tablet   Oral   Take 25 mg by mouth daily.         . RABEprazole (ACIPHEX) 20 MG tablet   Oral   Take 20 mg by mouth daily.          BP 157/61  Pulse 64  Temp(Src) 97.7 F (36.5 C) (Oral)  Resp 20  SpO2 98% Physical Exam  Nursing note and vitals reviewed. Constitutional: She is oriented to person, place, and time. She appears well-developed and well-nourished. No distress.  HENT:  Head: Normocephalic and atraumatic.  Eyes: EOM are normal.  Neck: Neck supple. No tracheal deviation present.  Cardiovascular: Normal rate.   Pulmonary/Chest: Effort normal. No respiratory distress.  Musculoskeletal: Normal range of motion.  Neurological: She is alert  and oriented to person, place, and time.  Skin: Skin is warm and dry.  Psychiatric: She has a normal mood and affect. Her behavior is normal.    ED Course   Procedures (including critical care time) Medications - No data to display  Labs Reviewed  CBC WITH DIFFERENTIAL - Abnormal; Notable for the following:    Hemoglobin 11.8 (*)    All other components within normal limits  BASIC METABOLIC PANEL - Abnormal; Notable for the following:    Glucose, Bld 123 (*)    Creatinine, Ser 1.47 (*)    GFR calc non Af Amer 29 (*)    GFR calc Af Amer 34 (*)    All other components within normal limits  URINALYSIS, ROUTINE W REFLEX MICROSCOPIC - Abnormal; Notable for the following:    Specific Gravity, Urine >1.030 (*)    Protein, ur TRACE (*)    All other components within normal limits  URINE MICROSCOPIC-ADD ON   Dg Chest Port 1 View  12/08/2012   *RADIOLOGY REPORT*   Clinical Data: 79 year old female with abnormal sensation. Shortness of breath.  PORTABLE CHEST - 1 VIEW  Comparison: 11/27/2009 and earlier.  Findings: Portable upright AP view at 0023 hours.  Stable large lung volumes. Stable cardiomegaly and mediastinal contours.  Stable left chest dual lead cardiac pacemaker.  Left humerus arthroplasty again noted.  And degenerative changes right shoulder.  Allowing for portable technique, the lungs are clear.  No pneumothorax or edema.  IMPRESSION: No acute cardiopulmonary abnormality.   Original Report Authenticated By: Erskine Speed, M.D.   1. Renal insufficiency   2. Dehydration     MDM  The patient has no focal neurologic abnormalities, she is able to move all 4 extremities and has a normal mentation, normal memory and normal interaction with me. She states that she just felt like her brain wasn't getting blood, she is not more specific than this but does specifically deny headache, shortness of breath, chest pain, palpitations or any other complaints. Will perform basic labs, urinalysis, chest x-ray and an EKG to rule out specific sources however the patient appears well at this time.   ED ECG REPORT  I personally interpreted this EKG   Date: 12/08/2012   Rate: 60  Rhythm: Atrial paced rhythm  QRS Axis: normal  Intervals: PR prolonged  ST/T Wave abnormalities: normal  Conduction Disutrbances:first-degree A-V block  right bundle branch block breath  Narrative Interpretation:   Old EKG Reviewed: Compared with 11/28/2009, ventricular pacing has been replaced with atrial pacing on the EKG. Right bundle branch block now present  The patient is well-appearing, labs show mild renal insufficiency consistent with prior testing, as well as mild dehydration based on the urinalysis and high specific gravity. She otherwise appears well for and can be discharged safely.  I personally performed the services described in this documentation, which was scribed in my  presence. The recorded information has been reviewed and is accurate.        Vida Roller, MD 12/08/12 [phone]

## 2012-12-07 NOTE — ED Notes (Signed)
Patient states she fell asleep watching television and woke up and states had a weird feeling.  Patient denies any pain or shortness of breath.  Patient states she cannot describe how she feels, but states she feels weird.  Patient borrowed her neighbor's O2 tank to help her.  Patient states an ounce of prevention is what she is doing; wants to be checked out.

## 2012-12-08 ENCOUNTER — Emergency Department (HOSPITAL_COMMUNITY): Payer: Medicare Other

## 2012-12-08 LAB — BASIC METABOLIC PANEL
CO2: 27 mEq/L (ref 19–32)
GFR calc non Af Amer: 29 mL/min — ABNORMAL LOW (ref >90)
Glucose, Bld: 123 mg/dL — ABNORMAL HIGH (ref 70–99)
Potassium: 4.7 mEq/L (ref 3.5–5.1)
Sodium: 137 mEq/L (ref 135–145)

## 2012-12-08 LAB — URINE MICROSCOPIC-ADD ON

## 2012-12-08 LAB — URINALYSIS, ROUTINE W REFLEX MICROSCOPIC
Hgb urine dipstick: NEGATIVE
Leukocytes, UA: NEGATIVE
Specific Gravity, Urine: >1.03 — ABNORMAL HIGH (ref 1.005–1.030)
Urobilinogen, UA: 0.2 mg/dL (ref 0.0–1.0)

## 2012-12-08 LAB — CBC WITH DIFFERENTIAL/PLATELET
Eosinophils Absolute: 0.3 10*3/uL (ref 0.0–0.7)
Lymphocytes Relative: 21 % (ref 12–46)
Lymphs Abs: 1.1 10*3/uL (ref 0.7–4.0)
Neutrophils Relative %: 65 % (ref 43–77)
Platelets: 163 10*3/uL (ref 150–400)
RBC: 3.94 MIL/uL (ref 3.87–5.11)
WBC: 5 10*3/uL (ref 4.0–10.5)

## 2012-12-30 ENCOUNTER — Encounter: Payer: Medicare Other | Admitting: *Deleted

## 2013-01-07 ENCOUNTER — Encounter: Payer: Self-pay | Admitting: *Deleted

## 2013-02-05 ENCOUNTER — Ambulatory Visit (INDEPENDENT_AMBULATORY_CARE_PROVIDER_SITE_OTHER): Payer: Medicare Other | Admitting: *Deleted

## 2013-02-05 ENCOUNTER — Other Ambulatory Visit: Payer: Self-pay | Admitting: Internal Medicine

## 2013-02-05 DIAGNOSIS — Z950 Presence of cardiac pacemaker: Secondary | ICD-10-CM

## 2013-02-05 DIAGNOSIS — I471 Supraventricular tachycardia: Secondary | ICD-10-CM

## 2013-02-17 LAB — REMOTE PACEMAKER DEVICE
AL IMPEDENCE PM: 470 Ohm
ATRIAL PACING PM: 86
BAMS-0001: 175 {beats}/min
BATTERY VOLTAGE: 2.8 V
RV LEAD IMPEDENCE PM: 606 Ohm
VENTRICULAR PACING PM: 0

## 2013-02-20 ENCOUNTER — Encounter: Payer: Self-pay | Admitting: *Deleted

## 2013-03-04 ENCOUNTER — Encounter: Payer: Self-pay | Admitting: Internal Medicine

## 2013-05-12 ENCOUNTER — Encounter: Payer: Medicare Other | Admitting: *Deleted

## 2013-05-20 ENCOUNTER — Encounter: Payer: Self-pay | Admitting: *Deleted

## 2013-05-30 ENCOUNTER — Other Ambulatory Visit: Payer: Self-pay | Admitting: Vascular Surgery

## 2013-05-30 DIAGNOSIS — Z48812 Encounter for surgical aftercare following surgery on the circulatory system: Secondary | ICD-10-CM

## 2013-05-30 DIAGNOSIS — I6529 Occlusion and stenosis of unspecified carotid artery: Secondary | ICD-10-CM

## 2013-06-17 ENCOUNTER — Ambulatory Visit (INDEPENDENT_AMBULATORY_CARE_PROVIDER_SITE_OTHER): Payer: Medicare Other | Admitting: *Deleted

## 2013-06-17 DIAGNOSIS — I471 Supraventricular tachycardia, unspecified: Secondary | ICD-10-CM

## 2013-06-17 DIAGNOSIS — I441 Atrioventricular block, second degree: Secondary | ICD-10-CM

## 2013-07-02 LAB — MDC_IDC_ENUM_SESS_TYPE_REMOTE
Brady Statistic AP VP Percent: 0 %
Brady Statistic AS VS Percent: 14 %
Date Time Interrogation Session: 20150225034917
Lead Channel Impedance Value: 469 Ohm
Lead Channel Impedance Value: 620 Ohm
Lead Channel Sensing Intrinsic Amplitude: 8 mV
Lead Channel Setting Sensing Sensitivity: 2.8 mV
MDC IDC MSMT BATTERY IMPEDANCE: 158 Ohm
MDC IDC MSMT BATTERY REMAINING LONGEVITY: 133 mo
MDC IDC MSMT BATTERY VOLTAGE: 2.8 V
MDC IDC MSMT LEADCHNL RA PACING THRESHOLD AMPLITUDE: 0.5 V
MDC IDC MSMT LEADCHNL RA PACING THRESHOLD PULSEWIDTH: 0.4 ms
MDC IDC MSMT LEADCHNL RV PACING THRESHOLD AMPLITUDE: 0.5 V
MDC IDC MSMT LEADCHNL RV PACING THRESHOLD PULSEWIDTH: 0.4 ms
MDC IDC SET LEADCHNL RA PACING AMPLITUDE: 2 V
MDC IDC SET LEADCHNL RV PACING AMPLITUDE: 2.5 V
MDC IDC SET LEADCHNL RV PACING PULSEWIDTH: 0.4 ms
MDC IDC STAT BRADY AP VS PERCENT: 85 %
MDC IDC STAT BRADY AS VP PERCENT: 0 %

## 2013-07-10 ENCOUNTER — Encounter: Payer: Self-pay | Admitting: *Deleted

## 2013-07-14 ENCOUNTER — Encounter: Payer: Self-pay | Admitting: Vascular Surgery

## 2013-07-14 ENCOUNTER — Encounter: Payer: Self-pay | Admitting: *Deleted

## 2013-07-15 ENCOUNTER — Ambulatory Visit (INDEPENDENT_AMBULATORY_CARE_PROVIDER_SITE_OTHER): Payer: Medicare Other | Admitting: Vascular Surgery

## 2013-07-15 ENCOUNTER — Ambulatory Visit (HOSPITAL_COMMUNITY)
Admission: RE | Admit: 2013-07-15 | Discharge: 2013-07-15 | Disposition: A | Discharge status: Home / Self Care | Payer: Medicare Other | Source: Ambulatory Visit | Attending: Vascular Surgery | Admitting: Vascular Surgery

## 2013-07-15 ENCOUNTER — Encounter: Payer: Self-pay | Admitting: Vascular Surgery

## 2013-07-15 VITALS — BP 186/73 | HR 77 | Resp 16 | Ht 62.0 in | Wt 160.0 lb

## 2013-07-15 DIAGNOSIS — Z48812 Encounter for surgical aftercare following surgery on the circulatory system: Secondary | ICD-10-CM

## 2013-07-15 DIAGNOSIS — I6529 Occlusion and stenosis of unspecified carotid artery: Secondary | ICD-10-CM

## 2013-07-15 NOTE — Progress Notes (Signed)
Subjective:     Patient ID: Kelli [name], female   DOB: 12/25/1915, 80 y.o.   MRN: [id number]  HPI this 80 year old female returns for continued followup regarding her left carotid endarterectomy performed in 2009. She had a severe asymptomatic left ICA stenosis. She has denied any neurologic symptoms since that time including lateralizing weakness, aphasia, amaurosis fugax, diplopia, blurred vision, or syncope. She did have an episode of shortness of breath a few years ago was found to have heart block and had a pacemaker inserted and is done well since that time. She had lites with a walker. She has no specific complaints about her health.  Past Medical History  Diagnosis Date  . Vasovagal syncope   . Hypertension   . Pacemaker   . Carotid artery disease   . Hypothyroidism   . GERD (gastroesophageal reflux disease)   . CHF (congestive heart failure)     diastolic in nature  . Sleep apnea     History  Substance Use Topics  . Smoking status: Never Smoker   . Smokeless tobacco: Never Used  . Alcohol Use: No    No family history on file.  Allergies  Allergen Reactions  . Penicillins   . Sulfonamide Derivatives     Current outpatient prescriptions:amLODipine (NORVASC) 10 MG tablet, Take 10 mg by mouth daily.  , Disp: , Rfl: ;  aspirin 81 MG tablet, Take 81 mg by mouth daily.  , Disp: , Rfl: ;  Levothyroxine Sodium 88 MCG CAPS, Take 1 capsule by mouth daily.  , Disp: , Rfl: ;  lisinopril (PRINIVIL,ZESTRIL) 20 MG tablet, Take 20 mg by mouth daily.  , Disp: , Rfl: ;  metoprolol succinate (TOPROL-XL) 25 MG 24 hr tablet, Take 25 mg by mouth daily., Disp: , Rfl:  RABEprazole (ACIPHEX) 20 MG tablet, Take 20 mg by mouth daily., Disp: , Rfl:   BP 186/73  Pulse 77  Resp 16  Ht 5\' 2"  (1.575 m)  Wt 160 lb (72.576 kg)  BMI 29.26 kg/m2  Body mass index is 29.26 kg/(m^2).           Review of Systems denies chest pain, dyspnea on exertion, PND, orthopnea, hemoptysis of lower  extremity edema, claudication. All systems negative and complete review of systems    Objective:   Physical Exam BP 186/73  Pulse 77  Resp 16  Ht 5\' 2"  (1.575 m)  Wt 160 lb (72.576 kg)  BMI 29.26 kg/m2  Gen.-alert and oriented x3 in no apparent distress HEENT normal for age Lungs no rhonchi or wheezing Cardiovascular regular rhythm no murmurs carotid pulses 3+ palpable no bruits audible Abdomen soft nontender no palpable masses Musculoskeletal free of  major deformities Skin clear -no rashes Neurologic normal Lower extremities 3+ femoral and dorsalis pedis pulses palpable bilaterally with no edema  Today ordered carotid duplex exam which are reviewed and interpreted. It looks totally normal with no evidence of flow reduction in either internal carotid artery.      Assessment:     80 year old female 6 years status post left carotid endarterectomy now with normal-appearing carotids and asymptomatic    Plan:     Will discontinue annual followup in this 80 year old female who is asymptomatic but widely patent carotid arteries If she has any neurologic symptoms she will be in touch with us for further followup

## 2013-07-22 ENCOUNTER — Encounter: Payer: Self-pay | Admitting: Internal Medicine

## 2013-10-06 ENCOUNTER — Encounter: Payer: Self-pay | Admitting: *Deleted

## 2013-12-30 ENCOUNTER — Encounter: Payer: Self-pay | Admitting: Internal Medicine

## 2013-12-30 ENCOUNTER — Ambulatory Visit (INDEPENDENT_AMBULATORY_CARE_PROVIDER_SITE_OTHER): Payer: Medicare Other | Admitting: Internal Medicine

## 2013-12-30 VITALS — BP 132/84 | HR 54 | Ht 62.0 in | Wt 161.6 lb

## 2013-12-30 DIAGNOSIS — I495 Sick sinus syndrome: Secondary | ICD-10-CM

## 2013-12-30 DIAGNOSIS — Z950 Presence of cardiac pacemaker: Secondary | ICD-10-CM

## 2013-12-30 DIAGNOSIS — I6529 Occlusion and stenosis of unspecified carotid artery: Secondary | ICD-10-CM

## 2013-12-30 DIAGNOSIS — I10 Essential (primary) hypertension: Secondary | ICD-10-CM

## 2013-12-30 NOTE — Progress Notes (Signed)
HPI Kelli [name] returns today for followup. She is a pleasant 80 yo woman with a h/o symptomatic bradycardia, s/p PPM, HTN, and diastolic CHF. In the interim she has done well.  No syncope. No peripheral edema. No falls. She notes mild symptoms of peripheral edema. Allergies  Allergen Reactions  . Penicillins Other (See Comments)    Unknown  . Sulfonamide Derivatives Other (See Comments)    Unknown     Current Outpatient Prescriptions  Medication Sig Dispense Refill  . amLODipine (NORVASC) 10 MG tablet Take 10 mg by mouth daily.        Marland Kitchen aspirin 81 MG tablet Take 81 mg by mouth daily.        . colchicine 0.6 MG tablet Take 0.6 mg by mouth daily as needed (Gout).      . Levothyroxine Sodium 88 MCG CAPS Take 1 capsule by mouth daily.        Marland Kitchen lisinopril (PRINIVIL,ZESTRIL) 20 MG tablet Take 20 mg by mouth daily.        . metoprolol succinate (TOPROL-XL) 25 MG 24 hr tablet Take 25 mg by mouth daily.      . RABEprazole (ACIPHEX) 20 MG tablet Take 20 mg by mouth daily.       No current facility-administered medications for this visit.     Past Medical History  Diagnosis Date  . Vasovagal syncope   . Hypertension   . Pacemaker   . Carotid artery disease   . Hypothyroidism   . GERD (gastroesophageal reflux disease)   . CHF (congestive heart failure)     diastolic in nature  . Sleep apnea     ROS:   All systems reviewed and negative except as noted in the HPI.   Past Surgical History  Procedure Laterality Date  . Pacemaker insertion    . Back surgery       No family history on file.   History   Social History  . Marital Status: Widowed    Spouse Name: N/A    Number of Children: N/A  . Years of Education: N/A   Occupational History  . Not on file.   Social History Main Topics  . Smoking status: Never Smoker   . Smokeless tobacco: Never Used  . Alcohol Use: No  . Drug Use: No  . Sexual Activity: Not on file   Other Topics Concern  . Not on file   Social  History Narrative  . No narrative on file     BP 132/84  Pulse 54  Ht  (1.575 m)  Wt 161 lb 9.6 oz (73.301 kg)  BMI 29.55 kg/m2  Physical Exam:  Well appearing elderly woman, NAD HEENT: Unremarkable Neck:  No JVD, no thyromegally Back:  No CVA tenderness Lungs:  Clear with no wheezes, rales, or rhonchi HEART:  Regular rate rhythm, no murmurs, no rubs, no clicks Abd:  soft, positive bowel sounds, no organomegally, no rebound, no guarding Ext:  2 plus pulses, no edema, no cyanosis, no clubbing Skin:  No rashes no nodules Neuro:  CN II through XII intact, motor grossly intact  DEVICE  Normal device function.  See PaceArt for details.   Assess/Plan:

## 2013-12-30 NOTE — Patient Instructions (Signed)
Your physician wants you to follow-up in: 12 months with Dr Ladona Ridgel in [place] You will receive a reminder letter in the mail two months in advance. If you don't receive a letter, please call our office to schedule the follow-up appointment.   Remote monitoring is used to monitor your Pacemaker of ICD from home. This monitoring reduces the number of office visits required to check your device to one time per year. It allows [place] to keep an eye on the functioning of your device to ensure it is working properly. You are scheduled for a device check from home on 04/02/14. You may send your transmission at any time that day. If you have a wireless device, the transmission will be sent automatically. After your physician reviews your transmission, you will receive a postcard with your next transmission date.

## 2013-12-30 NOTE — Assessment & Plan Note (Signed)
Her blood pressure is well controlled. She is encouraged to reduce her sodium intake.

## 2013-12-30 NOTE — Assessment & Plan Note (Signed)
Her Medtronic DDD PM is working normally. Will recheck in several months. 

## 2013-12-31 LAB — MDC_IDC_ENUM_SESS_TYPE_INCLINIC
Battery Impedance: 182 Ohm
Battery Remaining Longevity: 127 mo
Brady Statistic AP VP Percent: 0 %
Lead Channel Impedance Value: 463 Ohm
Lead Channel Impedance Value: 570 Ohm
Lead Channel Sensing Intrinsic Amplitude: 1 mV
Lead Channel Setting Pacing Amplitude: 2 V
Lead Channel Setting Pacing Pulse Width: 0.4 ms
MDC IDC MSMT BATTERY VOLTAGE: 2.8 V
MDC IDC MSMT LEADCHNL RA PACING THRESHOLD AMPLITUDE: 1.25 V
MDC IDC MSMT LEADCHNL RA PACING THRESHOLD PULSEWIDTH: 0.4 ms
MDC IDC MSMT LEADCHNL RV PACING THRESHOLD AMPLITUDE: 0.5 V
MDC IDC MSMT LEADCHNL RV PACING THRESHOLD PULSEWIDTH: 0.4 ms
MDC IDC MSMT LEADCHNL RV SENSING INTR AMPL: 8 mV
MDC IDC SESS DTM: 20150908202434
MDC IDC SET LEADCHNL RV PACING AMPLITUDE: 2.5 V
MDC IDC SET LEADCHNL RV SENSING SENSITIVITY: 2.8 mV
MDC IDC STAT BRADY AP VS PERCENT: 89 %
MDC IDC STAT BRADY AS VP PERCENT: 0 %
MDC IDC STAT BRADY AS VS PERCENT: 11 %

## 2014-01-30 ENCOUNTER — Encounter (HOSPITAL_COMMUNITY): Payer: Self-pay | Admitting: Emergency Medicine

## 2014-01-30 ENCOUNTER — Emergency Department (HOSPITAL_COMMUNITY): Payer: Medicare Other

## 2014-01-30 ENCOUNTER — Observation Stay (HOSPITAL_COMMUNITY)
Admission: EM | Admit: 2014-01-30 | Discharge: 2014-02-01 | Disposition: A | Discharge status: Home / Self Care | Payer: Medicare Other | Attending: Internal Medicine | Admitting: Internal Medicine

## 2014-01-30 DIAGNOSIS — I779 Disorder of arteries and arterioles, unspecified: Secondary | ICD-10-CM | POA: Diagnosis not present

## 2014-01-30 DIAGNOSIS — R0602 Shortness of breath: Secondary | ICD-10-CM | POA: Diagnosis present

## 2014-01-30 DIAGNOSIS — I499 Cardiac arrhythmia, unspecified: Secondary | ICD-10-CM | POA: Diagnosis not present

## 2014-01-30 DIAGNOSIS — I509 Heart failure, unspecified: Secondary | ICD-10-CM | POA: Insufficient documentation

## 2014-01-30 DIAGNOSIS — I10 Essential (primary) hypertension: Secondary | ICD-10-CM | POA: Diagnosis not present

## 2014-01-30 DIAGNOSIS — Z7982 Long term (current) use of aspirin: Secondary | ICD-10-CM | POA: Diagnosis not present

## 2014-01-30 DIAGNOSIS — E039 Hypothyroidism, unspecified: Secondary | ICD-10-CM | POA: Diagnosis not present

## 2014-01-30 DIAGNOSIS — K219 Gastro-esophageal reflux disease without esophagitis: Secondary | ICD-10-CM | POA: Diagnosis not present

## 2014-01-30 DIAGNOSIS — Z950 Presence of cardiac pacemaker: Secondary | ICD-10-CM | POA: Diagnosis not present

## 2014-01-30 DIAGNOSIS — Z79899 Other long term (current) drug therapy: Secondary | ICD-10-CM | POA: Insufficient documentation

## 2014-01-30 DIAGNOSIS — R0609 Other forms of dyspnea: Principal | ICD-10-CM | POA: Insufficient documentation

## 2014-01-30 DIAGNOSIS — Z880 Allergy status to penicillin: Secondary | ICD-10-CM | POA: Diagnosis not present

## 2014-01-30 DIAGNOSIS — E038 Other specified hypothyroidism: Secondary | ICD-10-CM

## 2014-01-30 DIAGNOSIS — R7989 Other specified abnormal findings of blood chemistry: Secondary | ICD-10-CM

## 2014-01-30 HISTORY — DX: Cardiac arrhythmia, unspecified: I49.9

## 2014-01-30 LAB — CBC WITH DIFFERENTIAL/PLATELET
Basophils Absolute: 0 10*3/uL (ref 0.0–0.1)
Basophils Relative: 1 % (ref 0–1)
EOS PCT: 5 % (ref 0–5)
Eosinophils Absolute: 0.2 10*3/uL (ref 0.0–0.7)
HCT: 38.9 % (ref 36.0–46.0)
HEMOGLOBIN: 12.9 g/dL (ref 12.0–15.0)
LYMPHS ABS: 0.7 10*3/uL (ref 0.7–4.0)
LYMPHS PCT: 17 % (ref 12–46)
MCH: 30.8 pg (ref 26.0–34.0)
MCHC: 33.2 g/dL (ref 30.0–36.0)
MCV: 92.8 fL (ref 78.0–100.0)
MONOS PCT: 7 % (ref 3–12)
Monocytes Absolute: 0.3 10*3/uL (ref 0.1–1.0)
Neutro Abs: 2.8 10*3/uL (ref 1.7–7.7)
Neutrophils Relative %: 70 % (ref 43–77)
PLATELETS: 179 10*3/uL (ref 150–400)
RBC: 4.19 MIL/uL (ref 3.87–5.11)
RDW: 13.6 % (ref 11.5–15.5)
WBC: 4 10*3/uL (ref 4.0–10.5)

## 2014-01-30 LAB — URINALYSIS, ROUTINE W REFLEX MICROSCOPIC
BILIRUBIN URINE: NEGATIVE
GLUCOSE, UA: NEGATIVE mg/dL
HGB URINE DIPSTICK: NEGATIVE
Ketones, ur: NEGATIVE mg/dL
Leukocytes, UA: NEGATIVE
Nitrite: NEGATIVE
PH: 5.5 (ref 5.0–8.0)
Protein, ur: 30 mg/dL — AB
SPECIFIC GRAVITY, URINE: 1.02 (ref 1.005–1.030)
UROBILINOGEN UA: 0.2 mg/dL (ref 0.0–1.0)

## 2014-01-30 LAB — BASIC METABOLIC PANEL
Anion gap: 14 (ref 5–15)
BUN: 15 mg/dL (ref 6–23)
CALCIUM: 9.8 mg/dL (ref 8.4–10.5)
CO2: 26 meq/L (ref 19–32)
Chloride: 100 mEq/L (ref 96–112)
Creatinine, Ser: 1.42 mg/dL — ABNORMAL HIGH (ref 0.50–1.10)
GFR calc Af Amer: 35 mL/min — ABNORMAL LOW (ref >90)
GFR calc non Af Amer: 30 mL/min — ABNORMAL LOW (ref >90)
GLUCOSE: 110 mg/dL — AB (ref 70–99)
POTASSIUM: 4.5 meq/L (ref 3.7–5.3)
SODIUM: 140 meq/L (ref 137–147)

## 2014-01-30 LAB — PRO B NATRIURETIC PEPTIDE: PRO B NATRI PEPTIDE: 792.8 pg/mL — AB (ref 0–450)

## 2014-01-30 LAB — TROPONIN I

## 2014-01-30 LAB — URINE MICROSCOPIC-ADD ON

## 2014-01-30 LAB — D-DIMER, QUANTITATIVE: D-Dimer, Quant: 1.42 ug/mL-FEU — ABNORMAL HIGH (ref 0.00–0.48)

## 2014-01-30 MED ORDER — OXYCODONE HCL 5 MG PO TABS
5.0000 mg | ORAL_TABLET | ORAL | Status: DC | PRN
  Filled 2014-01-30: qty 1

## 2014-01-30 MED ORDER — PANTOPRAZOLE SODIUM 40 MG PO TBEC
40.0000 mg | DELAYED_RELEASE_TABLET | Freq: Every day | ORAL | Status: DC
  Administered 2014-01-31 – 2014-02-01 (×2): 40 mg via ORAL
  Filled 2014-01-30 (×2): qty 1

## 2014-01-30 MED ORDER — ONDANSETRON HCL 4 MG/2ML IJ SOLN: 4.0000 mg | Freq: Four times a day (QID) | INTRAMUSCULAR | Status: DC | PRN

## 2014-01-30 MED ORDER — ONDANSETRON HCL 4 MG PO TABS
4.0000 mg | ORAL_TABLET | Freq: Four times a day (QID) | ORAL | Status: DC | PRN
  Filled 2014-01-30: qty 1

## 2014-01-30 MED ORDER — SODIUM CHLORIDE 0.9 % IV SOLN
INTRAVENOUS | Status: DC
  Administered 2014-01-31: 01:00:00 via INTRAVENOUS

## 2014-01-30 MED ORDER — SODIUM CHLORIDE 0.9 % IJ SOLN
3.0000 mL | Freq: Two times a day (BID) | INTRAMUSCULAR | Status: DC
  Administered 2014-01-31: 3 mL via INTRAVENOUS

## 2014-01-30 MED ORDER — ACETAMINOPHEN 650 MG RE SUPP: 650.0000 mg | Freq: Four times a day (QID) | RECTAL | Status: DC | PRN

## 2014-01-30 MED ORDER — ALUM & MAG HYDROXIDE-SIMETH 200-200-20 MG/5ML PO SUSP: 30.0000 mL | Freq: Four times a day (QID) | ORAL | Status: DC | PRN

## 2014-01-30 MED ORDER — ACETAMINOPHEN 325 MG PO TABS: 650.0000 mg | ORAL_TABLET | Freq: Four times a day (QID) | ORAL | Status: DC | PRN

## 2014-01-30 MED ORDER — HYDROMORPHONE HCL 1 MG/ML IJ SOLN: 0.5000 mg | INTRAMUSCULAR | Status: DC | PRN

## 2014-01-30 MED ORDER — LEVOTHYROXINE SODIUM 88 MCG PO TABS
88.0000 ug | ORAL_TABLET | Freq: Every day | ORAL | Status: DC
  Administered 2014-01-31 – 2014-02-01 (×2): 88 ug via ORAL
  Filled 2014-01-30 (×3): qty 1

## 2014-01-30 MED ORDER — AMLODIPINE BESYLATE 5 MG PO TABS
10.0000 mg | ORAL_TABLET | Freq: Every day | ORAL | Status: DC
  Administered 2014-01-31 – 2014-02-01 (×2): 10 mg via ORAL
  Filled 2014-01-30 (×2): qty 2

## 2014-01-30 MED ORDER — METOPROLOL SUCCINATE ER 25 MG PO TB24
25.0000 mg | ORAL_TABLET | Freq: Every day | ORAL | Status: DC
  Administered 2014-01-31 – 2014-02-01 (×2): 25 mg via ORAL
  Filled 2014-01-30 (×2): qty 1

## 2014-01-30 MED ORDER — LISINOPRIL 10 MG PO TABS
20.0000 mg | ORAL_TABLET | Freq: Every day | ORAL | Status: DC
  Administered 2014-01-31 – 2014-02-01 (×2): 20 mg via ORAL
  Filled 2014-01-30 (×2): qty 2

## 2014-01-30 MED ORDER — ASPIRIN 81 MG PO TABS
81.0000 mg | ORAL_TABLET | Freq: Every day | ORAL | Status: DC
  Filled 2014-01-30 (×3): qty 1

## 2014-01-30 MED ORDER — ENOXAPARIN SODIUM 30 MG/0.3ML ~~LOC~~ SOLN: 30.0000 mg | SUBCUTANEOUS | Status: DC

## 2014-01-30 NOTE — H&P (Addendum)
[facility] Admission History and Physical       Kelli [name]:[id number] DOB: 08-08-1915 DOA: 01/30/2014  Referring physician:  EDP PCP: Carylon Perches, MD  Specialists:   Chief Complaint: SOB  HPI: Kelli [name] is a 80 y.o. female with a history of CHF, HTN, Hypothyroid, SVT, S/P Medtronic DDD pacemaker who present to the ED with complaints of SOB that lasted for 20 minutes this AM.  She reports that she was sitting down at that time on her rollator walker.  She denies having chest pain, and denies feeling any palpitations.   She was referred for medical observation to a telemetry bed.   Her initial ED cardiac workup has been negative.     Review of Systems:  Constitutional: No Weight Loss, No Weight Gain, Night Sweats, Fevers, Chills, Dizziness, Fatigue, or Generalized Weakness HEENT: No Headaches, Difficulty Swallowing,Tooth/Dental Problems,Sore Throat,  No Sneezing, Rhinitis, Ear Ache, Nasal Congestion, or Post Nasal Drip,  Cardio-vascular:  No Chest pain, Orthopnea, PND, Edema in Lower Extremities, Anasarca, Dizziness, Palpitations  Resp:                       +Dyspnea, No DOE, No Productive Cough, No Non-Productive Cough, No Hemoptysis, No Wheezing.    GI: No Heartburn, Indigestion, Abdominal Pain, Nausea, Vomiting, Diarrhea, Hematemesis, Hematochezia, Melena, Change in Bowel Habits,  Loss of Appetite  GU: No Dysuria, Change in Color of Urine, No Urgency or Frequency, No Flank pain.  Musculoskeletal: No Joint Pain or Swelling, No Decreased Range of Motion, No Back Pain.  Neurologic: No Syncope, No Seizures, Muscle Weakness, Paresthesia, Vision Disturbance or Loss, No Diplopia, No Vertigo, No Difficulty Walking,  Skin: No Rash or Lesions. Psych: No Change in Mood or Affect, No Depression or Anxiety, No Memory loss, No Confusion, or Hallucinations   Past Medical History  Diagnosis Date  . Vasovagal syncope   . Hypertension   . Pacemaker     Medtronic DDD  .  Carotid artery disease   . Hypothyroidism   . GERD (gastroesophageal reflux disease)   . CHF (congestive heart failure)     diastolic in nature  . Sleep apnea   . Supraventricular arrhythmia     Past Surgical History  Procedure Laterality Date  . Pacemaker insertion    . Back surgery       Prior to Admission medications   Medication Sig Start Date End Date Taking? Authorizing Provider  levothyroxine (SYNTHROID, LEVOTHROID) 88 MCG tablet Take 88 mcg by mouth daily before breakfast.   Yes Historical Provider, MD  amLODipine (NORVASC) 10 MG tablet Take 10 mg by mouth daily.      Historical Provider, MD  aspirin 81 MG tablet Take 81 mg by mouth daily.      Historical Provider, MD  colchicine 0.6 MG tablet Take 0.6 mg by mouth daily as needed (Gout).    Historical Provider, MD  lisinopril (PRINIVIL,ZESTRIL) 20 MG tablet Take 20 mg by mouth daily.      Historical Provider, MD  metoprolol succinate (TOPROL-XL) 25 MG 24 hr tablet Take 25 mg by mouth daily.    Historical Provider, MD  RABEprazole (ACIPHEX) 20 MG tablet Take 20 mg by mouth daily.    Historical Provider, MD      Allergies  Allergen Reactions  . Penicillins Other (See Comments)    Unknown  . Sulfonamide Derivatives Other (See Comments)    Unknown     Social History:  Lives Independently,  Able to Perform all of her ADLs  reports that she has never smoked. She has never used smokeless tobacco. She reports that she does not drink alcohol or use illicit drugs.     No family history on file.     Physical Exam:  GEN:  Pleasant Elderly Obese 80 y.o. Caucasian female examined and in no acute distress; cooperative with exam Filed Vitals:   01/30/14 1805 01/30/14 2025 01/30/14 2030  BP: 162/45  145/120  Pulse: 64  60  Temp: 97.9 F (36.6 C)    TempSrc: Oral    Resp: 22    Height: 5\' 2"  (1.575 m)    Weight: 73.029 kg (161 lb)    SpO2: 95% 96% 95%   Blood pressure 145/120, pulse 60, temperature 97.9 F (36.6 C),  temperature source Oral, resp. rate 22, height 5\' 2"  (1.575 m), weight 73.029 kg (161 lb), SpO2 95.00%. PSYCH: She is alert and oriented x4; does not appear anxious does not appear depressed; affect is normal HEENT: Normocephalic and Atraumatic, Mucous membranes pink; PERRLA; EOM intact; Fundi:  Benign;  No scleral icterus, Nares: Patent, Oropharynx: Clear,Sparse Dentition,    Neck:  FROM, No Cervical Lymphadenopathy nor Thyromegaly or Carotid Bruit; No JVD; Breasts:: Not examined CHEST WALL: No tenderness CHEST: Normal respiration, clear to auscultation bilaterally HEART: Regular rate and rhythm; no murmurs rubs or gallops BACK: No kyphosis or scoliosis; No CVA tenderness ABDOMEN: Positive Bowel Sounds, Obese, Soft Non-Tender; No Masses, No Organomegaly, No Pannus; No Intertriginous candida. Rectal Exam: Not done EXTREMITIES: No Cyanosis, Clubbing, or Edema; No Ulcerations. Genitalia: not examined PULSES: 2+ and symmetric SKIN: Normal hydration no rash or ulceration CNS:  Alert and oriented x 4, No Focal Deficits , Extremely Hard of Hearing Vascular: pulses palpable throughout    Labs on Admission:  Basic Metabolic Panel:  Recent Labs Lab 01/30/14 1850  NA 140  K 4.5  CL 100  CO2 26  GLUCOSE 110*  BUN 15  CREATININE 1.42*  CALCIUM 9.8   Liver Function Tests: No results found for this basename: AST, ALT, ALKPHOS, BILITOT, PROT, ALBUMIN,  in the last 168 hours No results found for this basename: LIPASE, AMYLASE,  in the last 168 hours No results found for this basename: AMMONIA,  in the last 168 hours CBC:  Recent Labs Lab 01/30/14 1850  WBC 4.0  NEUTROABS 2.8  HGB 12.9  HCT 38.9  MCV 92.8  PLT 179   Cardiac Enzymes:  Recent Labs Lab 01/30/14 1850  TROPONINI <0.30    BNP (last 3 results)  Recent Labs  01/30/14 1850  PROBNP 792.8*   CBG: No results found for this basename: GLUCAP,  in the last 168 hours  Radiological Exams on Admission: Dg Chest 2  View  01/30/2014   CLINICAL DATA:  Difficulty breathing  EXAM: CHEST  2 VIEW  COMPARISON:  December 08, 2012  FINDINGS: There is a degree of underlying emphysematous change. There is no edema or consolidation. Heart is mildly enlarged with pulmonary vascularity within normal limits. Pacemaker leads are attached to the right atrium and right ventricle. No pneumothorax. There is a total shoulder replacement on the left. Bones overall are osteoporotic.  IMPRESSION: Underlying emphysematous change. Mild cardiomegaly. No edema or consolidation.   Electronically Signed   By: Bretta BangWilliam  Woodruff M.D.   On: 01/30/2014 19:14     EKG: Independently reviewed. Paced, RBBB, No Acute Changes   Assessment/Plan:  80 y.o. female with  Principal Problem:  1.    Exertional dyspnea   Telemetry Monitoring   Cycle troponins   Check D-Dimer      Active Problems:   2.   Essential hypertension, benign   Continue Amlodipine, Metoprolol, and Lisinopril Rx   Monitor BPs     3.   Hypothyroid   Continue Levothyroxine   Check TSH level    4.  DVT Prophylaxis    Lovenox     Code Status:  FULL CODE Family Communication:    Sons At Bedside Disposition Plan:    Observation Telemetry     Time spent:  60 MInutes  Ron ParkerJENKINS,Echo [place] [facility] Pager [phone]   If 7AM -7PM Please Contact the Day Rounding Team MD for [facility]  If 7PM-7AM, Please Contact Night-Floor Coverage  [email or web address] Password [email or web address] 01/30/2014, 9:45 PM

## 2014-01-30 NOTE — ED Notes (Signed)
Kelli [name] with the pacer interrogation team called and stated device and measurements are within normal limits, and it is functioning properly. States last check was December 30 2013, and there has been no high rates noted since that time. States he will fax over complete information to ED.

## 2014-01-30 NOTE — ED Provider Notes (Signed)
CSN: [id number]     Arrival date & time 01/30/14  1754 History   First MD Initiated Contact with Patient 01/30/14 1810     Chief Complaint  Patient presents with  . Shortness of Breath      HPI Pt was seen at 1835.  Per pt and her family, c/o sudden onset and resolution of one episode of SOB that occurred this afternoon PTA. Pt states she "woke up at 2:00pm the way I always do" (when pt's Home Health Aide arrives to house) and started to fix her own breakfast. Pt states she was "rolling her chair" to the kitchen when her symptoms began. Pt states the SOB "lasted a little while" before resolving. Pt states she feels "ok" while she is sitting on the stretcher now. Pt also c/o gradual onset and worsening of bilateral peripheral edema for the past several weeks. Denies CP/palpitations, no cough, no back pain, no abd pain, no N/V/D, no fevers, no syncope/near syncope, no rash, no injury, no calf/LE pain or unilateral swelling.   Past Medical History  Diagnosis Date  . Vasovagal syncope   . Hypertension   . Pacemaker     Medtronic DDD  . Carotid artery disease   . Hypothyroidism   . GERD (gastroesophageal reflux disease)   . CHF (congestive heart failure)     diastolic in nature  . Sleep apnea   . Supraventricular arrhythmia    Past Surgical History  Procedure Laterality Date  . Pacemaker insertion    . Back surgery      History  Substance Use Topics  . Smoking status: Never Smoker   . Smokeless tobacco: Never Used  . Alcohol Use: No    Review of Systems ROS: Statement: All systems negative except as marked or noted in the HPI; Constitutional: Negative for fever and chills. ; ; Eyes: Negative for eye pain, redness and discharge. ; ; ENMT: Negative for ear pain, hoarseness, nasal congestion, sinus pressure and sore throat. ; ; Cardiovascular: Negative for chest pain, palpitations, diaphoresis, +dyspnea and peripheral edema. ; ; Respiratory: Negative for cough, wheezing and stridor.  ; ; Gastrointestinal: Negative for nausea, vomiting, diarrhea, abdominal pain, blood in stool, hematemesis, jaundice and rectal bleeding. ; ; Genitourinary: Negative for dysuria, flank pain and hematuria. ; ; Musculoskeletal: Negative for back pain and neck pain. Negative for swelling and trauma.; ; Skin: Negative for pruritus, rash, abrasions, blisters, bruising and skin lesion.; ; Neuro: Negative for headache, lightheadedness and neck stiffness. Negative for weakness, altered level of consciousness , altered mental status, extremity weakness, paresthesias, involuntary movement, seizure and syncope.      Allergies  Penicillins and Sulfonamide derivatives  Home Medications   Prior to Admission medications   Medication Sig Start Date End Date Taking? Authorizing Provider  levothyroxine (SYNTHROID, LEVOTHROID) 88 MCG tablet Take 88 mcg by mouth daily before breakfast.   Yes Historical Provider, MD  amLODipine (NORVASC) 10 MG tablet Take 10 mg by mouth daily.      Historical Provider, MD  aspirin 81 MG tablet Take 81 mg by mouth daily.      Historical Provider, MD  colchicine 0.6 MG tablet Take 0.6 mg by mouth daily as needed (Gout).    Historical Provider, MD  lisinopril (PRINIVIL,ZESTRIL) 20 MG tablet Take 20 mg by mouth daily.      Historical Provider, MD  metoprolol succinate (TOPROL-XL) 25 MG 24 hr tablet Take 25 mg by mouth daily.    Historical Provider, MD  RABEprazole (ACIPHEX) 20 MG tablet Take 20 mg by mouth daily.    Historical Provider, MD   BP 145/120  Pulse 60  Temp(Src) 97.9 F (36.6 C) (Oral)  Resp 22  Ht 5\' 2"  (1.575 m)  Wt 161 lb (73.029 kg)  BMI 29.44 kg/m2  SpO2 95% Physical Exam 1840: Physical examination:  Nursing notes reviewed; Vital signs and O2 SAT reviewed;  Constitutional: Well developed, Well nourished, In no acute distress; Head:  Normocephalic, atraumatic; Eyes: EOMI, PERRL, No scleral icterus; ENMT: Mouth and pharynx normal, Mucous membranes dry; Neck:  Supple, Full range of motion, No lymphadenopathy; Cardiovascular: Regular rate and rhythm, No gallop; Respiratory: Breath sounds clear & equal bilaterally, No wheezes.  Speaking full sentences with ease, Normal respiratory effort/excursion; Chest: Nontender, Movement normal; Abdomen: Soft, Nontender, Nondistended, Normal bowel sounds; Genitourinary: No CVA tenderness; Extremities: Pulses normal, No tenderness, +2 pedal edema bilat without calf edema.; Neuro: AA&Ox3, +HOH, otherwise major CN grossly intact.  Speech clear. No gross focal motor or sensory deficits in extremities.; Skin: Color normal, Warm, Dry.   ED Course  Procedures     EKG Interpretation   Date/Time:  Friday January 30 2014 18:19:49 EDT Ventricular Rate:  61 PR Interval:    QRS Duration: 112 QT Interval:  464 QTC Calculation: 467 R Axis:   65 Text Interpretation:  Suspect unspecified pacemaker failure  Atrial-paced rhythm intermittent    underlying Atrial flutter Low voltage  QRS Incomplete right bundle branch block Abnormal ECG When compared with  ECG of 12/08/2012 Atrial flutter has replaced Atrial-paced rhythm Confirmed  by [facility]  MD, Nicholos JohnsKATHLEEN [phone]) on 01/30/2014 6:29:46 PM      EKG Interpretation  Date/Time:  Friday January 30 2014 21:47:48 EDT Ventricular Rate:  62 PR Interval:  188 QRS Duration: 124 QT Interval:  464 QTC Calculation: 470 R Axis:   56 Text Interpretation:  Normal sinus rhythm Right bundle branch block Septal infarct , age undetermined Abnormal ECG Since last tracing of earlier today Normal sinus rhythm has replaced Atrial flutter Confirmed by [facility]  MD, Nicholos JohnsKATHLEEN [phone]) on 01/30/2014 9:56:59 PM         MDM  MDM Reviewed: previous chart, nursing note and vitals Reviewed previous: labs and ECG Interpretation: ECG, labs and x-ray    Results for orders placed during the hospital encounter of 01/30/14  URINALYSIS, ROUTINE W REFLEX MICROSCOPIC      Result Value Ref Range    Color, Urine YELLOW  YELLOW   APPearance CLEAR  CLEAR   Specific Gravity, Urine 1.020  1.005 - 1.030   pH 5.5  5.0 - 8.0   Glucose, UA NEGATIVE  NEGATIVE mg/dL   Hgb urine dipstick NEGATIVE  NEGATIVE   Bilirubin Urine NEGATIVE  NEGATIVE   Ketones, ur NEGATIVE  NEGATIVE mg/dL   Protein, ur 30 (*) NEGATIVE mg/dL   Urobilinogen, UA 0.2  0.0 - 1.0 mg/dL   Nitrite NEGATIVE  NEGATIVE   Leukocytes, UA NEGATIVE  NEGATIVE  CBC WITH DIFFERENTIAL      Result Value Ref Range   WBC 4.0  4.0 - 10.5 K/uL   RBC 4.19  3.87 - 5.11 MIL/uL   Hemoglobin 12.9  12.0 - 15.0 g/dL   HCT 29.538.9  62.136.0 - 30.846.0 %   MCV 92.8  78.0 - 100.0 fL   MCH 30.8  26.0 - 34.0 pg   MCHC 33.2  30.0 - 36.0 g/dL   RDW 65.713.6  84.611.5 - 96.215.5 %   Platelets 179  150 - 400 K/uL   Neutrophils Relative % 70  43 - 77 %   Neutro Abs 2.8  1.7 - 7.7 K/uL   Lymphocytes Relative 17  12 - 46 %   Lymphs Abs 0.7  0.7 - 4.0 K/uL   Monocytes Relative 7  3 - 12 %   Monocytes Absolute 0.3  0.1 - 1.0 K/uL   Eosinophils Relative 5  0 - 5 %   Eosinophils Absolute 0.2  0.0 - 0.7 K/uL   Basophils Relative 1  0 - 1 %   Basophils Absolute 0.0  0.0 - 0.1 K/uL  BASIC METABOLIC PANEL      Result Value Ref Range   Sodium 140  137 - 147 mEq/L   Potassium 4.5  3.7 - 5.3 mEq/L   Chloride 100  96 - 112 mEq/L   CO2 26  19 - 32 mEq/L   Glucose, Bld 110 (*) 70 - 99 mg/dL   BUN 15  6 - 23 mg/dL   Creatinine, Ser 1.61 (*) 0.50 - 1.10 mg/dL   Calcium 9.8  8.4 - 09.6 mg/dL   GFR calc non Af Amer 30 (*) >90 mL/min   GFR calc Af Amer 35 (*) >90 mL/min   Anion gap 14  5 - 15  TROPONIN I      Result Value Ref Range   Troponin I <0.30  <0.30 ng/mL  PRO B NATRIURETIC PEPTIDE      Result Value Ref Range   Pro B Natriuretic peptide (BNP) 792.8 (*) 0 - 450 pg/mL  URINE MICROSCOPIC-ADD ON      Result Value Ref Range   Squamous Epithelial / LPF FEW (*) RARE   Bacteria, UA RARE  RARE   Dg Chest 2 View 01/30/2014   CLINICAL DATA:  Difficulty breathing  EXAM: CHEST   2 VIEW  COMPARISON:  December 08, 2012  FINDINGS: There is a degree of underlying emphysematous change. There is no edema or consolidation. Heart is mildly enlarged with pulmonary vascularity within normal limits. Pacemaker leads are attached to the right atrium and right ventricle. No pneumothorax. There is a total shoulder replacement on the left. Bones overall are osteoporotic.  IMPRESSION: Underlying emphysematous change. Mild cardiomegaly. No edema or consolidation.   Electronically Signed   By: Bretta Bang M.D.   On: 01/30/2014 19:14    Results for LIANNY, MOLTER (MRN [id number]) as of 01/30/2014 21:26  Ref. Range 06/27/2009 14:01 11/27/2009 18:49 01/30/2014 18:50  Pro B Natriuretic peptide (BNP) Latest Range: 0-450 pg/mL 763.0 (H) 71.2 792.8 (H)     2120:  1st EKG: appears to be underlying aflutter with intermittent atrial pacing. Pt denies CP/palpitations. 2nd EKG: NSR. Pt was evaluated by her Cards MD on 12/30/13: pt c/o peripheral edema, and her pacemaker was functioning correctly. Will have ED RN interrogate pacemaker tonight. Pt is not orthostatic on VS. Pt ambulated with a walker with O2 Sats initially dropping to 89% R/A, then increasing to 92-96% R/A.  VS otherwise normal. BNP mildly elevated but no acute CHF on CXR. Dx and testing d/w pt and family.  Questions answered.  Verb understanding, agreeable to observation admit. T/C to [facility]. Lovell Sheehan, case discussed, including:  HPI, pertinent PM/SHx, VS/PE, dx testing, ED course and treatment:  Agreeable to admit, requests to write temporary orders, obtain observation tele bed.    Samuel Jester, DO 02/01/14 2213

## 2014-01-31 ENCOUNTER — Observation Stay (HOSPITAL_COMMUNITY): Payer: Medicare Other

## 2014-01-31 DIAGNOSIS — R0609 Other forms of dyspnea: Secondary | ICD-10-CM | POA: Diagnosis not present

## 2014-01-31 LAB — BASIC METABOLIC PANEL
Anion gap: 11 (ref 5–15)
BUN: 14 mg/dL (ref 6–23)
CO2: 26 mEq/L (ref 19–32)
CREATININE: 1.31 mg/dL — AB (ref 0.50–1.10)
Calcium: 9.5 mg/dL (ref 8.4–10.5)
Chloride: 103 mEq/L (ref 96–112)
GFR, EST AFRICAN AMERICAN: 38 mL/min — AB (ref >90)
GFR, EST NON AFRICAN AMERICAN: 33 mL/min — AB (ref >90)
Glucose, Bld: 99 mg/dL (ref 70–99)
Potassium: 4.7 mEq/L (ref 3.7–5.3)
Sodium: 140 mEq/L (ref 137–147)

## 2014-01-31 LAB — CBC
HCT: 34.9 % — ABNORMAL LOW (ref 36.0–46.0)
Hemoglobin: 11.5 g/dL — ABNORMAL LOW (ref 12.0–15.0)
MCH: 30.2 pg (ref 26.0–34.0)
MCHC: 33 g/dL (ref 30.0–36.0)
MCV: 91.6 fL (ref 78.0–100.0)
PLATELETS: 153 10*3/uL (ref 150–400)
RBC: 3.81 MIL/uL — ABNORMAL LOW (ref 3.87–5.11)
RDW: 13.4 % (ref 11.5–15.5)
WBC: 3.5 10*3/uL — ABNORMAL LOW (ref 4.0–10.5)

## 2014-01-31 LAB — TROPONIN I
Troponin I: <0.3 ng/mL (ref <0.30)
Troponin I: <0.3 ng/mL (ref <0.30)

## 2014-01-31 MED ORDER — ENOXAPARIN SODIUM 80 MG/0.8ML ~~LOC~~ SOLN
70.0000 mg | Freq: Once | SUBCUTANEOUS | Status: AC
  Administered 2014-01-31: 70 mg via SUBCUTANEOUS
  Filled 2014-01-31: qty 0.8

## 2014-01-31 MED ORDER — IOHEXOL 350 MG/ML SOLN
100.0000 mL | Freq: Once | INTRAVENOUS | Status: AC | PRN
  Administered 2014-01-31: 100 mL via INTRAVENOUS

## 2014-01-31 MED ORDER — LORAZEPAM 1 MG PO TABS
1.0000 mg | ORAL_TABLET | ORAL | Status: DC | PRN
  Administered 2014-01-31: 1 mg via ORAL
  Filled 2014-01-31: qty 1

## 2014-01-31 MED ORDER — ENOXAPARIN SODIUM 80 MG/0.8ML ~~LOC~~ SOLN
70.0000 mg | SUBCUTANEOUS | Status: DC
  Filled 2014-01-31: qty 0.8

## 2014-01-31 MED ORDER — ASPIRIN EC 81 MG PO TBEC
81.0000 mg | DELAYED_RELEASE_TABLET | Freq: Every day | ORAL | Status: DC
  Administered 2014-01-31 – 2014-02-01 (×2): 81 mg via ORAL
  Filled 2014-01-31 (×2): qty 1

## 2014-01-31 NOTE — Progress Notes (Signed)
ANTICOAGULATION CONSULT NOTE - Initial Consult  Pharmacy Consult for Lovenox Indication: rule out PE  Allergies  Allergen Reactions  . Penicillins Other (See Comments)    Unknown  . Sulfonamide Derivatives Other (See Comments)    Unknown    Patient Measurements: Height: 5\' 2"  (157.5 cm) Weight: 159 lb 9.6 oz (72.394 kg) IBW/kg (Calculated) : 50.1  Vital Signs: Temp: 97.8 F (36.6 C) (10/10 0518) Temp Source: Oral (10/10 0518) BP: 165/45 mmHg (10/10 0826) Pulse Rate: 62 (10/10 0826)  Labs:  Recent Labs  01/30/14 1850 01/30/14 2218 01/31/14 0511  HGB 12.9  --  11.5*  HCT 38.9  --  34.9*  PLT 179  --  153  CREATININE 1.42*  --  1.31*  TROPONINI <0.30 <0.30 <0.30    Estimated Creatinine Clearance: 23.4 ml/min (by C-G formula based on Cr of 1.31).   Medical History: Past Medical History  Diagnosis Date  . Vasovagal syncope   . Hypertension   . Pacemaker     Medtronic DDD  . Carotid artery disease   . Hypothyroidism   . GERD (gastroesophageal reflux disease)   . CHF (congestive heart failure)     diastolic in nature  . Sleep apnea   . Supraventricular arrhythmia     Medications:  Scheduled:  . amLODipine  10 mg Oral Daily  . aspirin EC  81 mg Oral Daily  . levothyroxine  88 mcg Oral QAC breakfast  . lisinopril  20 mg Oral Daily  . metoprolol succinate  25 mg Oral Daily  . pantoprazole  40 mg Oral Daily  . sodium chloride  3 mL Intravenous Q12H    Assessment: 80 yo F who presented to ED with shortness of breath.  Lovenox 1mg /kg initiated for possible PE.  Chest CT was negative for PE.   No bleeding noted.   Goal of Therapy:  Anti-Xa level 0.6-1 units/ml 4hrs after LMWH dose given Monitor platelets by anticoagulation protocol: Yes   Plan:  Lovenox 70mg  sq q24h Monitor CBC **Consider change Lovenox to 30mg  sq daily for VTE px  Elson ClanLilliston, Aadhav Uhlig Michelle 01/31/2014,8:54 AM

## 2014-01-31 NOTE — Progress Notes (Signed)
Son made RN aware that patient reported to him that her SOB had been happening for about a week before she came to the hospital.

## 2014-01-31 NOTE — Progress Notes (Signed)
Pt ambulated in hallway with standby assist from NT and use of front wheel walker.  Pt c/o slight dizziness, but states "I just got out of bed".  Pt ambulated approximately 200 feet. Oxygen saturation ranged from 90-92% on RA. Pt assisted back to bed with call bell in reach.  Encouraged to call if needed.

## 2014-01-31 NOTE — Progress Notes (Signed)
Subjective: She was admitted with shortness of breath which is of unknown cause. She says she feels better but also feels like she needs oxygen. Her oxygen saturation has been normal. She has no other new complaints. She says she's coughing a little bit. She has some sinus congestion. She denies any chest pain.  Objective: Vital signs in last 24 hours: Temp:  [97.5 F (36.4 C)-97.9 F (36.6 C)] 97.8 F (36.6 C) (10/10 0518) Pulse Rate:  [60-65] 62 (10/10 0826) Resp:  [16-29] 16 (10/10 0518) BP: (131-167)/(45-120) 165/45 mmHg (10/10 0826) SpO2:  [92 %-96 %] 95 % (10/10 0518) Weight:  [72.394 kg (159 lb 9.6 oz)-73.029 kg (161 lb)] 72.394 kg (159 lb 9.6 oz) (10/09 2307) Weight change:  Last BM Date: 01/29/14  Intake/Output from previous day: 10/09 0701 - 10/10 0700 In: 265.8 [I.V.:265.8] Out: 350 [Urine:350]  PHYSICAL EXAM General appearance: alert, cooperative and mild distress Resp: clear to auscultation bilaterally Cardio: regular rate and rhythm, S1, S2 normal, no murmur, click, rub or gallop GI: soft, non-tender; bowel sounds normal; no masses,  no organomegaly Extremities: extremities normal, atraumatic, no cyanosis or edema  Lab Results:  Results for orders placed during the hospital encounter of 01/30/14 (from the past 48 hour(s))  CBC WITH DIFFERENTIAL     Status: None   Collection Time    01/30/14  6:50 PM      Result Value Ref Range   WBC 4.0  4.0 - 10.5 K/uL   RBC 4.19  3.87 - 5.11 MIL/uL   Hemoglobin 12.9  12.0 - 15.0 g/dL   HCT 38.9  36.0 - 46.0 %   MCV 92.8  78.0 - 100.0 fL   MCH 30.8  26.0 - 34.0 pg   MCHC 33.2  30.0 - 36.0 g/dL   RDW 13.6  11.5 - 15.5 %   Platelets 179  150 - 400 K/uL   Neutrophils Relative % 70  43 - 77 %   Neutro Abs 2.8  1.7 - 7.7 K/uL   Lymphocytes Relative 17  12 - 46 %   Lymphs Abs 0.7  0.7 - 4.0 K/uL   Monocytes Relative 7  3 - 12 %   Monocytes Absolute 0.3  0.1 - 1.0 K/uL   Eosinophils Relative 5  0 - 5 %   Eosinophils  Absolute 0.2  0.0 - 0.7 K/uL   Basophils Relative 1  0 - 1 %   Basophils Absolute 0.0  0.0 - 0.1 K/uL  BASIC METABOLIC PANEL     Status: Abnormal   Collection Time    01/30/14  6:50 PM      Result Value Ref Range   Sodium 140  137 - 147 mEq/L   Potassium 4.5  3.7 - 5.3 mEq/L   Chloride 100  96 - 112 mEq/L   CO2 26  19 - 32 mEq/L   Glucose, Bld 110 (*) 70 - 99 mg/dL   BUN 15  6 - 23 mg/dL   Creatinine, Ser 1.42 (*) 0.50 - 1.10 mg/dL   Calcium 9.8  8.4 - 10.5 mg/dL   GFR calc non Af Amer 30 (*) >90 mL/min   GFR calc Af Amer 35 (*) >90 mL/min   Comment: (NOTE)     The eGFR has been calculated using the CKD EPI equation.     This calculation has not been validated in all clinical situations.     eGFR's persistently <90 mL/min signify possible Chronic Kidney  Disease.   Anion gap 14  5 - 15  TROPONIN I     Status: None   Collection Time    01/30/14  6:50 PM      Result Value Ref Range   Troponin I <0.30  <0.30 ng/mL   Comment:            Due to the release kinetics of cTnI,     a negative result within the first hours     of the onset of symptoms does not rule out     myocardial infarction with certainty.     If myocardial infarction is still suspected,     repeat the test at appropriate intervals.  PRO B NATRIURETIC PEPTIDE     Status: Abnormal   Collection Time    01/30/14  6:50 PM      Result Value Ref Range   Pro B Natriuretic peptide (BNP) 792.8 (*) 0 - 450 pg/mL  URINALYSIS, ROUTINE W REFLEX MICROSCOPIC     Status: Abnormal   Collection Time    01/30/14  7:42 PM      Result Value Ref Range   Color, Urine YELLOW  YELLOW   APPearance CLEAR  CLEAR   Specific Gravity, Urine 1.020  1.005 - 1.030   pH 5.5  5.0 - 8.0   Glucose, UA NEGATIVE  NEGATIVE mg/dL   Hgb urine dipstick NEGATIVE  NEGATIVE   Bilirubin Urine NEGATIVE  NEGATIVE   Ketones, ur NEGATIVE  NEGATIVE mg/dL   Protein, ur 30 (*) NEGATIVE mg/dL   Urobilinogen, UA 0.2  0.0 - 1.0 mg/dL   Nitrite NEGATIVE   NEGATIVE   Leukocytes, UA NEGATIVE  NEGATIVE  URINE MICROSCOPIC-ADD ON     Status: Abnormal   Collection Time    01/30/14  7:42 PM      Result Value Ref Range   Squamous Epithelial / LPF FEW (*) RARE   Bacteria, UA RARE  RARE  TROPONIN I     Status: None   Collection Time    01/30/14 10:18 PM      Result Value Ref Range   Troponin I <0.30  <0.30 ng/mL   Comment:            Due to the release kinetics of cTnI,     a negative result within the first hours     of the onset of symptoms does not rule out     myocardial infarction with certainty.     If myocardial infarction is still suspected,     repeat the test at appropriate intervals.  D-DIMER, QUANTITATIVE     Status: Abnormal   Collection Time    01/30/14 10:18 PM      Result Value Ref Range   D-Dimer, Quant 1.42 (*) 0.00 - 0.48 ug/mL-FEU   Comment:            AT THE INHOUSE ESTABLISHED CUTOFF     VALUE OF 0.48 ug/mL FEU,     THIS ASSAY HAS BEEN DOCUMENTED     IN THE LITERATURE TO HAVE     A SENSITIVITY AND NEGATIVE     PREDICTIVE VALUE OF AT LEAST     98 TO 99%.  THE TEST RESULT     SHOULD BE CORRELATED WITH     AN ASSESSMENT OF THE CLINICAL     PROBABILITY OF DVT / VTE.  TROPONIN I     Status: None   Collection Time    01/31/14  5:11 AM      Result Value Ref Range   Troponin I <0.30  <0.30 ng/mL   Comment:            Due to the release kinetics of cTnI,     a negative result within the first hours     of the onset of symptoms does not rule out     myocardial infarction with certainty.     If myocardial infarction is still suspected,     repeat the test at appropriate intervals.  BASIC METABOLIC PANEL     Status: Abnormal   Collection Time    01/31/14  5:11 AM      Result Value Ref Range   Sodium 140  137 - 147 mEq/L   Potassium 4.7  3.7 - 5.3 mEq/L   Chloride 103  96 - 112 mEq/L   CO2 26  19 - 32 mEq/L   Glucose, Bld 99  70 - 99 mg/dL   BUN 14  6 - 23 mg/dL   Creatinine, Ser 1.31 (*) 0.50 - 1.10 mg/dL    Calcium 9.5  8.4 - 10.5 mg/dL   GFR calc non Af Amer 33 (*) >90 mL/min   GFR calc Af Amer 38 (*) >90 mL/min   Comment: (NOTE)     The eGFR has been calculated using the CKD EPI equation.     This calculation has not been validated in all clinical situations.     eGFR's persistently <90 mL/min signify possible Chronic Kidney     Disease.   Anion gap 11  5 - 15  CBC     Status: Abnormal   Collection Time    01/31/14  5:11 AM      Result Value Ref Range   WBC 3.5 (*) 4.0 - 10.5 K/uL   RBC 3.81 (*) 3.87 - 5.11 MIL/uL   Hemoglobin 11.5 (*) 12.0 - 15.0 g/dL   HCT 34.9 (*) 36.0 - 46.0 %   MCV 91.6  78.0 - 100.0 fL   MCH 30.2  26.0 - 34.0 pg   MCHC 33.0  30.0 - 36.0 g/dL   RDW 13.4  11.5 - 15.5 %   Platelets 153  150 - 400 K/uL    ABGS No results found for this basename: PHART, PCO2, PO2ART, TCO2, HCO3,  in the last 72 hours CULTURES No results found for this or any previous visit (from the past 240 hour(s)). Studies/Results: Dg Chest 2 View  01/30/2014   CLINICAL DATA:  Difficulty breathing  EXAM: CHEST  2 VIEW  COMPARISON:  December 08, 2012  FINDINGS: There is a degree of underlying emphysematous change. There is no edema or consolidation. Heart is mildly enlarged with pulmonary vascularity within normal limits. Pacemaker leads are attached to the right atrium and right ventricle. No pneumothorax. There is a total shoulder replacement on the left. Bones overall are osteoporotic.  IMPRESSION: Underlying emphysematous change. Mild cardiomegaly. No edema or consolidation.   Electronically Signed   By: Lowella Grip M.D.   On: 01/30/2014 19:14   Ct Angio Chest Pe W/cm &/or Wo Cm  01/31/2014   CLINICAL DATA:  Weakness shortness of breath x1 day. Initial evaluation.  EXAM: CT ANGIOGRAPHY CHEST WITH CONTRAST  TECHNIQUE: Multidetector CT imaging of the chest was performed using the standard protocol during bolus administration of intravenous contrast. Multiplanar CT image reconstructions and  MIPs were obtained to evaluate the vascular anatomy.  CONTRAST:  165m OMNIPAQUE IOHEXOL 350 MG/ML  SOLN  COMPARISON:  Chest x-ray 01/30/2014.  FINDINGS: Thoracic aorta normal caliber. No evidence of aneurysm or dissection. Atherosclerotic vascular changes are noted. Pulmonary arteries are normal. Cardiomegaly. Coronary artery disease. Cardiac pacer. Small pericardial effusion.  Shotty mediastinal lymph nodes.  Thoracic esophagus unremarkable.  Large airways are patent. Mild bibasilar atelectasis. No significant pleural effusion or pneumothorax.  Adrenals are unremarkable. Sludge and/or stones may be present in the gallbladder. Further evaluation with right upper quadrant ultrasound can be obtained.  Visualized thyroid unremarkable. Shotty axillary lymph nodes. Severe degenerative changes thoracic spine.  Review of the MIP images confirms the above findings.  IMPRESSION: 1. No evidence of pulmonary embolus. 2. Coronary artery disease. 3. Small pericardial effusion. 4. Cardiomegaly.  Cardiac pacer is present. 5. Mild basilar subsegmental atelectasis. 6. Possible sludge and/or stones in the gallbladder. Further evaluation with right upper quadrant ultrasound can be obtained as needed.   Electronically Signed   By: Marcello Moores  Register   On: 01/31/2014 02:56    Medications:  Prior to Admission:  Prescriptions prior to admission  Medication Sig Dispense Refill  . amLODipine (NORVASC) 10 MG tablet Take 10 mg by mouth daily.        Marland Kitchen aspirin 81 MG tablet Take 81 mg by mouth daily.        . colchicine 0.6 MG tablet Take 0.6 mg by mouth daily as needed (Gout).      Marland Kitchen levothyroxine (SYNTHROID, LEVOTHROID) 88 MCG tablet Take 88 mcg by mouth daily before breakfast.      . lisinopril (PRINIVIL,ZESTRIL) 20 MG tablet Take 20 mg by mouth daily.        . metoprolol succinate (TOPROL-XL) 25 MG 24 hr tablet Take 25 mg by mouth daily.      . RABEprazole (ACIPHEX) 20 MG tablet Take 20 mg by mouth daily.       Scheduled: .  amLODipine  10 mg Oral Daily  . aspirin EC  81 mg Oral Daily  . levothyroxine  88 mcg Oral QAC breakfast  . lisinopril  20 mg Oral Daily  . metoprolol succinate  25 mg Oral Daily  . pantoprazole  40 mg Oral Daily  . sodium chloride  3 mL Intravenous Q12H   Continuous: . sodium chloride 50 mL/hr at 01/31/14 0041   JQG:[id number], acetaminophen, alum & mag hydroxide-simeth, HYDROmorphone (DILAUDID) injection, ondansetron (ZOFRAN) IV, ondansetron, oxyCODONE  Assesment: She was admitted with shortness of breath and is not totally clear what that was from. She says she's better but still feels like she needs the oxygen. She has no other new complaints. She has ruled out for myocardial infarction. Her chest x-ray showed no acute infiltrates. Principal Problem:   Exertional dyspnea Active Problems:   Essential hypertension, benign   Hypothyroid   SOB (shortness of breath)    Plan: Considering her advanced age I am going to keep her in the hospital again tonight simply make sure there are not missing some problem causing her to be short of breath. At this point I'm not sure why she had the issue. Probably home in the morning    LOS: 1 day   Andrius Andrepont L 01/31/2014, 10:33 AM

## 2014-01-31 NOTE — Progress Notes (Signed)
[facility] Instructed to contact on call pharmacist to dose lovenox. Received order from pharmacist for a one time dose, lovenox 70mg . Received order to discontinue previous lovenox order for 30mg  Q24 hrs.

## 2014-01-31 NOTE — Progress Notes (Signed)
UR completed 

## 2014-02-01 DIAGNOSIS — R0609 Other forms of dyspnea: Secondary | ICD-10-CM | POA: Diagnosis not present

## 2014-02-01 NOTE — Progress Notes (Signed)
Subjective: She feels well and has no new complaints. She says her breathing is back to normal. She did have some anxiety yesterday.  Objective: Vital signs in last 24 hours: Temp:  [97.5 F (36.4 C)-97.8 F (36.6 C)] 97.5 F (36.4 C) (10/11 0601) Pulse Rate:  [63-66] 66 (10/11 0601) Resp:  [16-20] 20 (10/11 0601) BP: (129-158)/(55-56) 129/56 mmHg (10/11 0601) SpO2:  [94 %-95 %] 94 % (10/11 0601) Weight change:  Last BM Date: 01/31/14  Intake/Output from previous day: 10/10 0701 - 10/11 0700 In: 1913.3 [P.O.:720; I.V.:1193.3] Out: 625 [Urine:625]  PHYSICAL EXAM General appearance: alert, cooperative and no distress Resp: clear to auscultation bilaterally Cardio: regular rate and rhythm, S1, S2 normal, no murmur, click, rub or gallop GI: soft, non-tender; bowel sounds normal; no masses,  no organomegaly Extremities: extremities normal, atraumatic, no cyanosis or edema  Lab Results:  Results for orders placed during the hospital encounter of 01/30/14 (from the past 48 hour(s))  CBC WITH DIFFERENTIAL     Status: None   Collection Time    01/30/14  6:50 PM      Result Value Ref Range   WBC 4.0  4.0 - 10.5 K/uL   RBC 4.19  3.87 - 5.11 MIL/uL   Hemoglobin 12.9  12.0 - 15.0 g/dL   HCT 38.9  36.0 - 46.0 %   MCV 92.8  78.0 - 100.0 fL   MCH 30.8  26.0 - 34.0 pg   MCHC 33.2  30.0 - 36.0 g/dL   RDW 13.6  11.5 - 15.5 %   Platelets 179  150 - 400 K/uL   Neutrophils Relative % 70  43 - 77 %   Neutro Abs 2.8  1.7 - 7.7 K/uL   Lymphocytes Relative 17  12 - 46 %   Lymphs Abs 0.7  0.7 - 4.0 K/uL   Monocytes Relative 7  3 - 12 %   Monocytes Absolute 0.3  0.1 - 1.0 K/uL   Eosinophils Relative 5  0 - 5 %   Eosinophils Absolute 0.2  0.0 - 0.7 K/uL   Basophils Relative 1  0 - 1 %   Basophils Absolute 0.0  0.0 - 0.1 K/uL  BASIC METABOLIC PANEL     Status: Abnormal   Collection Time    01/30/14  6:50 PM      Result Value Ref Range   Sodium 140  137 - 147 mEq/L   Potassium 4.5  3.7 -  5.3 mEq/L   Chloride 100  96 - 112 mEq/L   CO2 26  19 - 32 mEq/L   Glucose, Bld 110 (*) 70 - 99 mg/dL   BUN 15  6 - 23 mg/dL   Creatinine, Ser 1.42 (*) 0.50 - 1.10 mg/dL   Calcium 9.8  8.4 - 10.5 mg/dL   GFR calc non Af Amer 30 (*) >90 mL/min   GFR calc Af Amer 35 (*) >90 mL/min   Comment: (NOTE)     The eGFR has been calculated using the CKD EPI equation.     This calculation has not been validated in all clinical situations.     eGFR's persistently <90 mL/min signify possible Chronic Kidney     Disease.   Anion gap 14  5 - 15  TROPONIN I     Status: None   Collection Time    01/30/14  6:50 PM      Result Value Ref Range   Troponin I <0.30  <0.30 ng/mL  Comment:            Due to the release kinetics of cTnI,     a negative result within the first hours     of the onset of symptoms does not rule out     myocardial infarction with certainty.     If myocardial infarction is still suspected,     repeat the test at appropriate intervals.  PRO B NATRIURETIC PEPTIDE     Status: Abnormal   Collection Time    01/30/14  6:50 PM      Result Value Ref Range   Pro B Natriuretic peptide (BNP) 792.8 (*) 0 - 450 pg/mL  URINALYSIS, ROUTINE W REFLEX MICROSCOPIC     Status: Abnormal   Collection Time    01/30/14  7:42 PM      Result Value Ref Range   Color, Urine YELLOW  YELLOW   APPearance CLEAR  CLEAR   Specific Gravity, Urine 1.020  1.005 - 1.030   pH 5.5  5.0 - 8.0   Glucose, UA NEGATIVE  NEGATIVE mg/dL   Hgb urine dipstick NEGATIVE  NEGATIVE   Bilirubin Urine NEGATIVE  NEGATIVE   Ketones, ur NEGATIVE  NEGATIVE mg/dL   Protein, ur 30 (*) NEGATIVE mg/dL   Urobilinogen, UA 0.2  0.0 - 1.0 mg/dL   Nitrite NEGATIVE  NEGATIVE   Leukocytes, UA NEGATIVE  NEGATIVE  URINE MICROSCOPIC-ADD ON     Status: Abnormal   Collection Time    01/30/14  7:42 PM      Result Value Ref Range   Squamous Epithelial / LPF FEW (*) RARE   Bacteria, UA RARE  RARE  TROPONIN I     Status: None   Collection  Time    01/30/14 10:18 PM      Result Value Ref Range   Troponin I <0.30  <0.30 ng/mL   Comment:            Due to the release kinetics of cTnI,     a negative result within the first hours     of the onset of symptoms does not rule out     myocardial infarction with certainty.     If myocardial infarction is still suspected,     repeat the test at appropriate intervals.  D-DIMER, QUANTITATIVE     Status: Abnormal   Collection Time    01/30/14 10:18 PM      Result Value Ref Range   D-Dimer, Quant 1.42 (*) 0.00 - 0.48 ug/mL-FEU   Comment:            AT THE INHOUSE ESTABLISHED CUTOFF     VALUE OF 0.48 ug/mL FEU,     THIS ASSAY HAS BEEN DOCUMENTED     IN THE LITERATURE TO HAVE     A SENSITIVITY AND NEGATIVE     PREDICTIVE VALUE OF AT LEAST     98 TO 99%.  THE TEST RESULT     SHOULD BE CORRELATED WITH     AN ASSESSMENT OF THE CLINICAL     PROBABILITY OF DVT / VTE.  TROPONIN I     Status: None   Collection Time    01/31/14  5:11 AM      Result Value Ref Range   Troponin I <0.30  <0.30 ng/mL   Comment:            Due to the release kinetics of cTnI,     a negative result within  the first hours     of the onset of symptoms does not rule out     myocardial infarction with certainty.     If myocardial infarction is still suspected,     repeat the test at appropriate intervals.  BASIC METABOLIC PANEL     Status: Abnormal   Collection Time    01/31/14  5:11 AM      Result Value Ref Range   Sodium 140  137 - 147 mEq/L   Potassium 4.7  3.7 - 5.3 mEq/L   Chloride 103  96 - 112 mEq/L   CO2 26  19 - 32 mEq/L   Glucose, Bld 99  70 - 99 mg/dL   BUN 14  6 - 23 mg/dL   Creatinine, Ser 1.31 (*) 0.50 - 1.10 mg/dL   Calcium 9.5  8.4 - 10.5 mg/dL   GFR calc non Af Amer 33 (*) >90 mL/min   GFR calc Af Amer 38 (*) >90 mL/min   Comment: (NOTE)     The eGFR has been calculated using the CKD EPI equation.     This calculation has not been validated in all clinical situations.     eGFR's  persistently <90 mL/min signify possible Chronic Kidney     Disease.   Anion gap 11  5 - 15  CBC     Status: Abnormal   Collection Time    01/31/14  5:11 AM      Result Value Ref Range   WBC 3.5 (*) 4.0 - 10.5 K/uL   RBC 3.81 (*) 3.87 - 5.11 MIL/uL   Hemoglobin 11.5 (*) 12.0 - 15.0 g/dL   HCT 34.9 (*) 36.0 - 46.0 %   MCV 91.6  78.0 - 100.0 fL   MCH 30.2  26.0 - 34.0 pg   MCHC 33.0  30.0 - 36.0 g/dL   RDW 13.4  11.5 - 15.5 %   Platelets 153  150 - 400 K/uL  TROPONIN I     Status: None   Collection Time    01/31/14 10:10 AM      Result Value Ref Range   Troponin I <0.30  <0.30 ng/mL   Comment:            Due to the release kinetics of cTnI,     a negative result within the first hours     of the onset of symptoms does not rule out     myocardial infarction with certainty.     If myocardial infarction is still suspected,     repeat the test at appropriate intervals.    ABGS No results found for this basename: PHART, PCO2, PO2ART, TCO2, HCO3,  in the last 72 hours CULTURES No results found for this or any previous visit (from the past 240 hour(s)). Studies/Results: Dg Chest 2 View  01/30/2014   CLINICAL DATA:  Difficulty breathing  EXAM: CHEST  2 VIEW  COMPARISON:  December 08, 2012  FINDINGS: There is a degree of underlying emphysematous change. There is no edema or consolidation. Heart is mildly enlarged with pulmonary vascularity within normal limits. Pacemaker leads are attached to the right atrium and right ventricle. No pneumothorax. There is a total shoulder replacement on the left. Bones overall are osteoporotic.  IMPRESSION: Underlying emphysematous change. Mild cardiomegaly. No edema or consolidation.   Electronically Signed   By: Lowella Grip M.D.   On: 01/30/2014 19:14   Ct Angio Chest Pe W/cm &/or Wo Cm  01/31/2014  CLINICAL DATA:  Weakness shortness of breath x1 day. Initial evaluation.  EXAM: CT ANGIOGRAPHY CHEST WITH CONTRAST  TECHNIQUE: Multidetector CT  imaging of the chest was performed using the standard protocol during bolus administration of intravenous contrast. Multiplanar CT image reconstructions and MIPs were obtained to evaluate the vascular anatomy.  CONTRAST:  179m OMNIPAQUE IOHEXOL 350 MG/ML SOLN  COMPARISON:  Chest x-ray 01/30/2014.  FINDINGS: Thoracic aorta normal caliber. No evidence of aneurysm or dissection. Atherosclerotic vascular changes are noted. Pulmonary arteries are normal. Cardiomegaly. Coronary artery disease. Cardiac pacer. Small pericardial effusion.  Shotty mediastinal lymph nodes.  Thoracic esophagus unremarkable.  Large airways are patent. Mild bibasilar atelectasis. No significant pleural effusion or pneumothorax.  Adrenals are unremarkable. Sludge and/or stones may be present in the gallbladder. Further evaluation with right upper quadrant ultrasound can be obtained.  Visualized thyroid unremarkable. Shotty axillary lymph nodes. Severe degenerative changes thoracic spine.  Review of the MIP images confirms the above findings.  IMPRESSION: 1. No evidence of pulmonary embolus. 2. Coronary artery disease. 3. Small pericardial effusion. 4. Cardiomegaly.  Cardiac pacer is present. 5. Mild basilar subsegmental atelectasis. 6. Possible sludge and/or stones in the gallbladder. Further evaluation with right upper quadrant ultrasound can be obtained as needed.   Electronically Signed   By: TMarcello Moores Register   On: 01/31/2014 02:56    Medications:  Prior to Admission:  Prescriptions prior to admission  Medication Sig Dispense Refill  . amLODipine (NORVASC) 10 MG tablet Take 10 mg by mouth daily.        .Marland Kitchenaspirin 81 MG tablet Take 81 mg by mouth daily.        . colchicine 0.6 MG tablet Take 0.6 mg by mouth daily as needed (Gout).      .Marland Kitchenlevothyroxine (SYNTHROID, LEVOTHROID) 88 MCG tablet Take 88 mcg by mouth daily before breakfast.      . lisinopril (PRINIVIL,ZESTRIL) 20 MG tablet Take 20 mg by mouth daily.        . metoprolol  succinate (TOPROL-XL) 25 MG 24 hr tablet Take 25 mg by mouth daily.      . RABEprazole (ACIPHEX) 20 MG tablet Take 20 mg by mouth daily.       Scheduled: . amLODipine  10 mg Oral Daily  . aspirin EC  81 mg Oral Daily  . enoxaparin (LOVENOX) injection  70 mg Subcutaneous Q24H  . levothyroxine  88 mcg Oral QAC breakfast  . lisinopril  20 mg Oral Daily  . metoprolol succinate  25 mg Oral Daily  . pantoprazole  40 mg Oral Daily  . sodium chloride  3 mL Intravenous Q12H   Continuous: . sodium chloride 50 mL/hr at 01/31/14 0041   PDXI:[id number] acetaminophen, alum & mag hydroxide-simeth, HYDROmorphone (DILAUDID) injection, LORazepam, ondansetron (ZOFRAN) IV, ondansetron, oxyCODONE  Assesment: She was admitted with shortness of breath. It's not really clear why she had this. Testing thus far has been negative. She is at maximum hospital benefit. Principal Problem:   Exertional dyspnea Active Problems:   Essential hypertension, benign   Hypothyroid   SOB (shortness of breath)    Plan: Discharge home today    LOS: 2 days   Kelli [name] L 02/01/2014, 8:32 AM

## 2014-02-01 NOTE — Progress Notes (Signed)
Pt discharged home today per Dr. Juanetta GoslingHawkins. Pt's IV site D/C'd and WDL. VSS. Pt and patient's son provided with home medication list, discharge instructions. Verbalized understanding. Pt left floor via WC in stable condition accompanied by RN.

## 2014-02-01 NOTE — Discharge Summary (Signed)
Physician Discharge Summary  Patient ID: Kelli [name] MRN: [id number] DOB/AGE: May 17, 1915 80 y.o. Primary Care Physician:FAGAN,ROY, MD Admit date: 01/30/2014 Discharge date: 02/01/2014    Discharge Diagnoses:   Principal Problem:   Exertional dyspnea Active Problems:   Essential hypertension, benign   Hypothyroid   SOB (shortness of breath)     Medication List         amLODipine 10 MG tablet  Commonly known as:  NORVASC  Take 10 mg by mouth daily.     aspirin 81 MG tablet  Take 81 mg by mouth daily.     colchicine 0.6 MG tablet  Take 0.6 mg by mouth daily as needed (Gout).     levothyroxine 88 MCG tablet  Commonly known as:  SYNTHROID, LEVOTHROID  Take 88 mcg by mouth daily before breakfast.     lisinopril 20 MG tablet  Commonly known as:  PRINIVIL,ZESTRIL  Take 20 mg by mouth daily.     metoprolol succinate 25 MG 24 hr tablet  Commonly known as:  TOPROL-XL  Take 25 mg by mouth daily.     RABEprazole 20 MG tablet  Commonly known as:  ACIPHEX  Take 20 mg by mouth daily.        Discharged Condition: Improved    Consults: None  Significant Diagnostic Studies: Dg Chest 2 View  01/30/2014   CLINICAL DATA:  Difficulty breathing  EXAM: CHEST  2 VIEW  COMPARISON:  December 08, 2012  FINDINGS: There is a degree of underlying emphysematous change. There is no edema or consolidation. Heart is mildly enlarged with pulmonary vascularity within normal limits. Pacemaker leads are attached to the right atrium and right ventricle. No pneumothorax. There is a total shoulder replacement on the left. Bones overall are osteoporotic.  IMPRESSION: Underlying emphysematous change. Mild cardiomegaly. No edema or consolidation.   Electronically Signed   By: Bretta BangWilliam  Woodruff M.D.   On: 01/30/2014 19:14   Ct Angio Chest Pe W/cm &/or Wo Cm  01/31/2014   CLINICAL DATA:  Weakness shortness of breath x1 day. Initial evaluation.  EXAM: CT ANGIOGRAPHY CHEST WITH CONTRAST  TECHNIQUE:  Multidetector CT imaging of the chest was performed using the standard protocol during bolus administration of intravenous contrast. Multiplanar CT image reconstructions and MIPs were obtained to evaluate the vascular anatomy.  CONTRAST:  100mL OMNIPAQUE IOHEXOL 350 MG/ML SOLN  COMPARISON:  Chest x-ray 01/30/2014.  FINDINGS: Thoracic aorta normal caliber. No evidence of aneurysm or dissection. Atherosclerotic vascular changes are noted. Pulmonary arteries are normal. Cardiomegaly. Coronary artery disease. Cardiac pacer. Small pericardial effusion.  Shotty mediastinal lymph nodes.  Thoracic esophagus unremarkable.  Large airways are patent. Mild bibasilar atelectasis. No significant pleural effusion or pneumothorax.  Adrenals are unremarkable. Sludge and/or stones may be present in the gallbladder. Further evaluation with right upper quadrant ultrasound can be obtained.  Visualized thyroid unremarkable. Shotty axillary lymph nodes. Severe degenerative changes thoracic spine.  Review of the MIP images confirms the above findings.  IMPRESSION: 1. No evidence of pulmonary embolus. 2. Coronary artery disease. 3. Small pericardial effusion. 4. Cardiomegaly.  Cardiac pacer is present. 5. Mild basilar subsegmental atelectasis. 6. Possible sludge and/or stones in the gallbladder. Further evaluation with right upper quadrant ultrasound can be obtained as needed.   Electronically Signed   By: Maisie Fushomas  Register   On: 01/31/2014 02:56    Lab Results: Basic Metabolic Panel:  Recent Labs  13/24/4009/01/06 1850 01/31/14 0511  NA 140 140  K 4.5 4.7  CL  100 103  CO2 26 26  GLUCOSE 110* 99  BUN 15 14  CREATININE 1.42* 1.31*  CALCIUM 9.8 9.5   Liver Function Tests: No results found for this basename: AST, ALT, ALKPHOS, BILITOT, PROT, ALBUMIN,  in the last 72 hours   CBC:  Recent Labs  01/30/14 1850 01/31/14 0511  WBC 4.0 3.5*  NEUTROABS 2.8  --   HGB 12.9 11.5*  HCT 38.9 34.9*  MCV 92.8 91.6  PLT 179 153     No results found for this or any previous visit (from the past 240 hour(s)).   Hospital Course: This is a [age] your old who said that she was in her usual state of good health at home when she developed acute shortness of breath that lasted for about 30 minutes. She eventually came to the emergency department. Workup was essentially negative. She was brought in for observation and had no further episodes of shortness of breath. She later told her family that she had some shortness of breath for about a week prior to her admission. She was able to ambulate in the hall without oxygen with no dyspnea and no hypoxia. She did have an episode of anxiety while she was in the hospital but that did not seem to give her the same symptoms as her shortness of breath. She was at maximum hospital benefit back at baseline at the time of discharge  Discharge Exam: Blood pressure 129/56, pulse 66, temperature 97.5 F (36.4 C), temperature source Oral, resp. rate 20, height 5\' 2"  (1.575 m), weight 72.394 kg (159 lb 9.6 oz), SpO2 94.00%. She is awake and alert. Her chest is clear. Heart is regular. Abdomen is soft.  Disposition: Home she has an appointment to see Dr. Ouida SillsFagan in his office in 2 days.      Discharge Instructions   Discharge patient    Complete by:  As directed              Signed: [name]   02/01/2014, 8:34 AM

## 2014-02-02 LAB — URINE CULTURE

## 2014-02-09 ENCOUNTER — Ambulatory Visit (HOSPITAL_COMMUNITY)
Admission: RE | Admit: 2014-02-09 | Discharge: 2014-02-09 | Disposition: A | Discharge status: Home / Self Care | Payer: Medicare Other | Source: Ambulatory Visit | Attending: Internal Medicine | Admitting: Internal Medicine

## 2014-02-09 DIAGNOSIS — I080 Rheumatic disorders of both mitral and aortic valves: Secondary | ICD-10-CM | POA: Insufficient documentation

## 2014-02-09 DIAGNOSIS — R0600 Dyspnea, unspecified: Secondary | ICD-10-CM | POA: Insufficient documentation

## 2014-02-09 DIAGNOSIS — G4730 Sleep apnea, unspecified: Secondary | ICD-10-CM | POA: Diagnosis not present

## 2014-02-09 DIAGNOSIS — I10 Essential (primary) hypertension: Secondary | ICD-10-CM | POA: Diagnosis not present

## 2014-02-09 DIAGNOSIS — I471 Supraventricular tachycardia: Secondary | ICD-10-CM | POA: Diagnosis not present

## 2014-02-09 DIAGNOSIS — I319 Disease of pericardium, unspecified: Secondary | ICD-10-CM

## 2014-02-09 DIAGNOSIS — R0609 Other forms of dyspnea: Secondary | ICD-10-CM

## 2014-02-09 DIAGNOSIS — I509 Heart failure, unspecified: Secondary | ICD-10-CM | POA: Insufficient documentation

## 2014-02-09 NOTE — Progress Notes (Signed)
  Echocardiogram 2D Echocardiogram has been performed.  Retha Bither 02/09/2014, 10:35 AM

## 2014-02-24 ENCOUNTER — Encounter (HOSPITAL_COMMUNITY): Payer: Self-pay | Admitting: Physical Medicine and Rehabilitation

## 2014-02-24 ENCOUNTER — Inpatient Hospital Stay (HOSPITAL_COMMUNITY)
Admission: EM | Admit: 2014-02-24 | Discharge: 2014-02-27 | DRG: 641 | Disposition: A | Discharge status: Skilled Nursing Facility | Payer: Medicare Other | Attending: Internal Medicine | Admitting: Internal Medicine

## 2014-02-24 ENCOUNTER — Emergency Department (HOSPITAL_COMMUNITY): Payer: Medicare Other

## 2014-02-24 DIAGNOSIS — E871 Hypo-osmolality and hyponatremia: Secondary | ICD-10-CM | POA: Diagnosis present

## 2014-02-24 DIAGNOSIS — R627 Adult failure to thrive: Secondary | ICD-10-CM | POA: Diagnosis present

## 2014-02-24 DIAGNOSIS — R630 Anorexia: Secondary | ICD-10-CM | POA: Diagnosis present

## 2014-02-24 DIAGNOSIS — F05 Delirium due to known physiological condition: Secondary | ICD-10-CM

## 2014-02-24 DIAGNOSIS — F329 Major depressive disorder, single episode, unspecified: Secondary | ICD-10-CM | POA: Diagnosis present

## 2014-02-24 DIAGNOSIS — I129 Hypertensive chronic kidney disease with stage 1 through stage 4 chronic kidney disease, or unspecified chronic kidney disease: Secondary | ICD-10-CM | POA: Diagnosis present

## 2014-02-24 DIAGNOSIS — R531 Weakness: Secondary | ICD-10-CM

## 2014-02-24 DIAGNOSIS — N289 Disorder of kidney and ureter, unspecified: Secondary | ICD-10-CM

## 2014-02-24 DIAGNOSIS — Z7982 Long term (current) use of aspirin: Secondary | ICD-10-CM

## 2014-02-24 DIAGNOSIS — E860 Dehydration: Principal | ICD-10-CM | POA: Diagnosis present

## 2014-02-24 DIAGNOSIS — N183 Chronic kidney disease, stage 3 (moderate): Secondary | ICD-10-CM | POA: Diagnosis present

## 2014-02-24 DIAGNOSIS — G4730 Sleep apnea, unspecified: Secondary | ICD-10-CM | POA: Diagnosis present

## 2014-02-24 DIAGNOSIS — I10 Essential (primary) hypertension: Secondary | ICD-10-CM | POA: Diagnosis present

## 2014-02-24 DIAGNOSIS — E039 Hypothyroidism, unspecified: Secondary | ICD-10-CM | POA: Diagnosis present

## 2014-02-24 DIAGNOSIS — K219 Gastro-esophageal reflux disease without esophagitis: Secondary | ICD-10-CM | POA: Diagnosis present

## 2014-02-24 DIAGNOSIS — Z950 Presence of cardiac pacemaker: Secondary | ICD-10-CM | POA: Diagnosis not present

## 2014-02-24 DIAGNOSIS — I5032 Chronic diastolic (congestive) heart failure: Secondary | ICD-10-CM | POA: Diagnosis present

## 2014-02-24 DIAGNOSIS — N184 Chronic kidney disease, stage 4 (severe): Secondary | ICD-10-CM | POA: Diagnosis present

## 2014-02-24 DIAGNOSIS — Z6827 Body mass index (BMI) 27.0-27.9, adult: Secondary | ICD-10-CM | POA: Diagnosis not present

## 2014-02-24 LAB — COMPREHENSIVE METABOLIC PANEL
ALT: 6 U/L (ref 0–35)
AST: 15 U/L (ref 0–37)
Albumin: 3.5 g/dL (ref 3.5–5.2)
Alkaline Phosphatase: 77 U/L (ref 39–117)
Anion gap: 10 (ref 5–15)
BUN: 18 mg/dL (ref 6–23)
CALCIUM: 10 mg/dL (ref 8.4–10.5)
CHLORIDE: 93 meq/L — AB (ref 96–112)
CO2: 27 meq/L (ref 19–32)
Creatinine, Ser: 1.48 mg/dL — ABNORMAL HIGH (ref 0.50–1.10)
GFR calc Af Amer: 33 mL/min — ABNORMAL LOW (ref >90)
GFR, EST NON AFRICAN AMERICAN: 29 mL/min — AB (ref >90)
Glucose, Bld: 118 mg/dL — ABNORMAL HIGH (ref 70–99)
Potassium: 5.3 mEq/L (ref 3.7–5.3)
Sodium: 130 mEq/L — ABNORMAL LOW (ref 137–147)
Total Bilirubin: 0.2 mg/dL — ABNORMAL LOW (ref 0.3–1.2)
Total Protein: 6.8 g/dL (ref 6.0–8.3)

## 2014-02-24 LAB — TROPONIN I: Troponin I: <0.3 ng/mL (ref <0.30)

## 2014-02-24 LAB — CBC WITH DIFFERENTIAL/PLATELET
BASOS ABS: 0 10*3/uL (ref 0.0–0.1)
Basophils Relative: 0 % (ref 0–1)
EOS PCT: 3 % (ref 0–5)
Eosinophils Absolute: 0.2 10*3/uL (ref 0.0–0.7)
HCT: 38.5 % (ref 36.0–46.0)
Hemoglobin: 12.8 g/dL (ref 12.0–15.0)
LYMPHS PCT: 16 % (ref 12–46)
Lymphs Abs: 0.9 10*3/uL (ref 0.7–4.0)
MCH: 29.8 pg (ref 26.0–34.0)
MCHC: 33.2 g/dL (ref 30.0–36.0)
MCV: 89.5 fL (ref 78.0–100.0)
Monocytes Absolute: 0.6 10*3/uL (ref 0.1–1.0)
Monocytes Relative: 11 % (ref 3–12)
NEUTROS ABS: 4 10*3/uL (ref 1.7–7.7)
Neutrophils Relative %: 70 % (ref 43–77)
PLATELETS: 189 10*3/uL (ref 150–400)
RBC: 4.3 MIL/uL (ref 3.87–5.11)
RDW: 13.3 % (ref 11.5–15.5)
WBC: 5.8 10*3/uL (ref 4.0–10.5)

## 2014-02-24 LAB — I-STAT CG4 LACTIC ACID, ED: Lactic Acid, Venous: 0.81 mmol/L (ref 0.5–2.2)

## 2014-02-24 LAB — I-STAT TROPONIN, ED: TROPONIN I, POC: 0.02 ng/mL (ref 0.00–0.08)

## 2014-02-24 LAB — URINALYSIS, ROUTINE W REFLEX MICROSCOPIC
Bilirubin Urine: NEGATIVE
GLUCOSE, UA: NEGATIVE mg/dL
Hgb urine dipstick: NEGATIVE
KETONES UR: NEGATIVE mg/dL
LEUKOCYTES UA: NEGATIVE
Nitrite: NEGATIVE
Protein, ur: NEGATIVE mg/dL
Specific Gravity, Urine: 1.004 — ABNORMAL LOW (ref 1.005–1.030)
Urobilinogen, UA: 0.2 mg/dL (ref 0.0–1.0)
pH: 7.5 (ref 5.0–8.0)

## 2014-02-24 LAB — TSH: TSH: 5.84 u[IU]/mL — AB (ref 0.350–4.500)

## 2014-02-24 LAB — PRO B NATRIURETIC PEPTIDE: Pro B Natriuretic peptide (BNP): 386.7 pg/mL (ref 0–450)

## 2014-02-24 MED ORDER — ACETAMINOPHEN 325 MG PO TABS: 650.0000 mg | ORAL_TABLET | Freq: Four times a day (QID) | ORAL | Status: DC | PRN

## 2014-02-24 MED ORDER — ZOLPIDEM TARTRATE 5 MG PO TABS: 5.0000 mg | ORAL_TABLET | Freq: Every evening | ORAL | Status: DC | PRN

## 2014-02-24 MED ORDER — ONDANSETRON HCL 4 MG/2ML IJ SOLN
4.0000 mg | Freq: Four times a day (QID) | INTRAMUSCULAR | Status: DC | PRN
Start: 2014-02-24 — End: 2014-02-27

## 2014-02-24 MED ORDER — COLCHICINE 0.6 MG PO TABS
0.6000 mg | ORAL_TABLET | Freq: Every day | ORAL | Status: DC | PRN
  Filled 2014-02-24: qty 1

## 2014-02-24 MED ORDER — METOPROLOL SUCCINATE ER 25 MG PO TB24
25.0000 mg | ORAL_TABLET | Freq: Every day | ORAL | Status: DC
  Administered 2014-02-25 – 2014-02-27 (×3): 25 mg via ORAL
  Filled 2014-02-24 (×3): qty 1

## 2014-02-24 MED ORDER — HYDROCODONE-ACETAMINOPHEN 5-325 MG PO TABS: 1.0000 | ORAL_TABLET | ORAL | Status: DC | PRN

## 2014-02-24 MED ORDER — ACETAMINOPHEN 650 MG RE SUPP: 650.0000 mg | Freq: Four times a day (QID) | RECTAL | Status: DC | PRN

## 2014-02-24 MED ORDER — LEVOTHYROXINE SODIUM 88 MCG PO TABS
88.0000 ug | ORAL_TABLET | Freq: Every day | ORAL | Status: DC
  Administered 2014-02-25 – 2014-02-27 (×3): 88 ug via ORAL
  Filled 2014-02-24 (×4): qty 1

## 2014-02-24 MED ORDER — AMLODIPINE BESYLATE 10 MG PO TABS
10.0000 mg | ORAL_TABLET | Freq: Every day | ORAL | Status: DC
  Administered 2014-02-25 – 2014-02-26 (×2): 10 mg via ORAL
  Filled 2014-02-24 (×3): qty 1

## 2014-02-24 MED ORDER — ALPRAZOLAM 0.25 MG PO TABS: 0.1250 mg | ORAL_TABLET | Freq: Two times a day (BID) | ORAL | Status: DC | PRN

## 2014-02-24 MED ORDER — SODIUM CHLORIDE 0.9 % IV SOLN
INTRAVENOUS | Status: DC
  Administered 2014-02-24 – 2014-02-26 (×2): via INTRAVENOUS

## 2014-02-24 MED ORDER — ASPIRIN 81 MG PO TABS: 81.0000 mg | ORAL_TABLET | Freq: Every day | ORAL | Status: DC

## 2014-02-24 MED ORDER — HEPARIN SODIUM (PORCINE) 5000 UNIT/ML IJ SOLN
5000.0000 [IU] | Freq: Three times a day (TID) | INTRAMUSCULAR | Status: DC
  Administered 2014-02-24 – 2014-02-27 (×8): 5000 [IU] via SUBCUTANEOUS
  Filled 2014-02-24 (×12): qty 1

## 2014-02-24 MED ORDER — SODIUM CHLORIDE 0.9 % IV BOLUS (SEPSIS)
500.0000 mL | Freq: Once | INTRAVENOUS | Status: AC
  Administered 2014-02-24: 500 mL via INTRAVENOUS

## 2014-02-24 MED ORDER — MIRTAZAPINE 7.5 MG PO TABS
7.5000 mg | ORAL_TABLET | Freq: Every day | ORAL | Status: DC
  Administered 2014-02-24 – 2014-02-26 (×3): 7.5 mg via ORAL
  Filled 2014-02-24 (×4): qty 1

## 2014-02-24 MED ORDER — ONDANSETRON HCL 4 MG PO TABS: 4.0000 mg | ORAL_TABLET | Freq: Four times a day (QID) | ORAL | Status: DC | PRN

## 2014-02-24 MED ORDER — AMLODIPINE BESYLATE 5 MG PO TABS
5.0000 mg | ORAL_TABLET | Freq: Every day | ORAL | Status: DC
  Filled 2014-02-24 (×2): qty 1

## 2014-02-24 MED ORDER — PANTOPRAZOLE SODIUM 40 MG PO TBEC
40.0000 mg | DELAYED_RELEASE_TABLET | Freq: Every day | ORAL | Status: DC
  Administered 2014-02-25 – 2014-02-27 (×3): 40 mg via ORAL
  Filled 2014-02-24 (×2): qty 1

## 2014-02-24 MED ORDER — ASPIRIN EC 81 MG PO TBEC
81.0000 mg | DELAYED_RELEASE_TABLET | Freq: Every day | ORAL | Status: DC
  Administered 2014-02-25 – 2014-02-27 (×3): 81 mg via ORAL
  Filled 2014-02-24 (×3): qty 1

## 2014-02-24 NOTE — ED Notes (Signed)
Patient transported to X-ray 

## 2014-02-24 NOTE — H&P (Signed)
[facility]                                                                                    Patient Demographics  Kelli [name], is a 80 y.o. female  CSN: [id number]  MRN: [id number]  DOB - 10/26/1915  Admit Date - 02/24/2014  Outpatient Primary MD for the patient is Carylon PerchesFAGAN,ROY, MD   With History of -  Past Medical History  Diagnosis Date  . Vasovagal syncope   . Hypertension   . Pacemaker     [employer] DDD  . Carotid artery disease   . Hypothyroidism   . GERD (gastroesophageal reflux disease)   . CHF (congestive heart failure)     diastolic in nature  . Sleep apnea   . Supraventricular arrhythmia       Past Surgical History  Procedure Laterality Date  . Pacemaker insertion    . Back surgery      in for   Chief Complaint  Patient presents with  . Weakness  . Altered Mental Status  . Shortness of Breath     HPI  Kelli [name]  is a 80 y.o. female, with past medical history significant for hypertension, sick sinus syndrome status post pacemaker, and congestive heart failure presenting with few days history of nausea, decreased appetite, generalized weakness and inability to perform ADLs. Her history started 2-3 weeks ago with an admission in the hospital for weakness, she improved clinically and was discharged to followup with her primary medical doctor who started her on Lasix and Wellbutrin for depression. Since then her situation started deteriorating and she has been more confused than usual according to the sons with decreased appetite and weakness. Her PMD stopped the Lasix and Wellbutrin ,but no significant improvement. When she called today he advised that she comes to the emergency room for evaluation.    Review of Systems    In addition to the HPI above,  No Fever-chills, No Headache, No changes with Vision or hearing, No problems swallowing food or Liquids, No Cough or Shortness of Breath, No Abdominal pain, No  Vommitting, Bowel  movements are regular, No Blood in stool or Urine, No dysuria, No new skin rashes or bruises, No new joints pains-aches,  No recent weight gain or loss, No polyuria, polydypsia or polyphagia, No significant Mental Stressors.  A full 10 point Review of Systems was done, except as stated above, all other Review of Systems were negative.   Social History History  Substance Use Topics  . Smoking status: Never Smoker   . Smokeless tobacco: Never Used  . Alcohol Use: No     Family History Significant for hypertension, COPD and history of lung cancer  Prior to Admission medications   Medication Sig Start Date End Date Taking? Authorizing Provider  ALPRAZolam (XANAX) 0.25 MG tablet Take 0.125-0.25 mg by mouth 2 (two) times daily as needed for anxiety.   Yes Historical Provider, MD  amLODipine (NORVASC) 10 MG tablet Take 10 mg by mouth daily.     Yes Historical Provider, MD  aspirin 81 MG tablet Take 81 mg by mouth daily.     Yes Historical Provider, MD  colchicine 0.6 MG tablet Take 0.6 mg by mouth daily as needed (Gout).   Yes Historical Provider, MD  levothyroxine (SYNTHROID, LEVOTHROID) 88 MCG tablet Take 88 mcg by mouth daily before breakfast.   Yes Historical Provider, MD  lisinopril (PRINIVIL,ZESTRIL) 20 MG tablet Take 20 mg by mouth daily.     Yes Historical Provider, MD  metoprolol succinate (TOPROL-XL) 25 MG 24 hr tablet Take 25 mg by mouth daily.   Yes Historical Provider, MD  prochlorperazine (COMPAZINE) 10 MG tablet Take 10 mg by mouth every 6 (six) hours as needed for nausea or vomiting.   Yes Historical Provider, MD  RABEprazole (ACIPHEX) 20 MG tablet Take 20 mg by mouth daily.   Yes Historical Provider, MD    Allergies  Allergen Reactions  . Penicillins Other (See Comments)    Unknown  . Sulfonamide Derivatives Other (See Comments)    Unknown    Physical Exam  Vitals  Blood pressure 153/50, pulse 61, temperature 97.8 F (36.6 C), temperature source Oral, resp.  rate 13, height 5\' 3"  (1.6 m), weight 72.576 kg (160 lb), SpO2 93 %.   1. General well developed elderly female who looks dehydrated  2. Normal affect and insight, Not Suicidal or Homicidal, Awake Alert, Oriented X 3.  3. No F.N deficits grossly, .  4. Ears and Eyes appear Normal, Conjunctivae clear, PERRLA. dry Oral Mucosa.  5. Supple Neck, No JVD, No cervical lymphadenopathy appriciated, No Carotid Bruits.  6. Symmetrical Chest wall movement, Good air movement bilaterally, CTAB.  7. RRR, No Gallops, Rubs or Murmurs, No Parasternal Heave.  8. Positive Bowel Sounds, Abdomen Soft, Non tender, No organomegaly appriciated,No rebound -guarding or rigidity.  9.  No Cyanosis, Normal Skin Turgor, No Skin Rash or Bruise.  10. Good muscle tone,  joints appear normal , no effusions, Normal ROM.  11. No Palpable Lymph Nodes in Neck or Axillae    Data Review  CBC  Recent Labs Lab 02/24/14 1627  WBC 5.8  HGB 12.8  HCT 38.5  PLT 189  MCV 89.5  MCH 29.8  MCHC 33.2  RDW 13.3  LYMPHSABS 0.9  MONOABS 0.6  EOSABS 0.2  BASOSABS 0.0   ------------------------------------------------------------------------------------------------------------------  Chemistries   Recent Labs Lab 02/24/14 1627  NA 130*  K 5.3  CL 93*  CO2 27  GLUCOSE 118*  BUN 18  CREATININE 1.48*  CALCIUM 10.0  AST 15  ALT 6  ALKPHOS 77  BILITOT 0.2*   ------------------------------------------------------------------------------------------------------------------ estimated creatinine clearance is 21.2 mL/min (by C-G formula based on Cr of 1.48). ------------------------------------------------------------------------------------------------------------------ No results for input(s): TSH, T4TOTAL, T3FREE, THYROIDAB in the last 72 hours.  Invalid input(s): FREET3   Coagulation profile No results for input(s): INR, PROTIME in the last 168  hours. ------------------------------------------------------------------------------------------------------------------- No results for input(s): DDIMER in the last 72 hours. -------------------------------------------------------------------------------------------------------------------  Cardiac Enzymes  Recent Labs Lab 02/24/14 1627  TROPONINI <0.30   ------------------------------------------------------------------------------------------------------------------ Invalid input(s): POCBNP   ---------------------------------------------------------------------------------------------------------------  Urinalysis    Component Value Date/Time   COLORURINE YELLOW 02/24/2014 1755   APPEARANCEUR CLEAR 02/24/2014 1755   LABSPEC 1.004* 02/24/2014 1755   PHURINE 7.5 02/24/2014 1755   GLUCOSEU NEGATIVE 02/24/2014 1755   HGBUR NEGATIVE 02/24/2014 1755   BILIRUBINUR NEGATIVE 02/24/2014 1755   KETONESUR NEGATIVE 02/24/2014 1755   PROTEINUR NEGATIVE 02/24/2014 1755   UROBILINOGEN 0.2 02/24/2014 1755   NITRITE NEGATIVE 02/24/2014 1755   LEUKOCYTESUR NEGATIVE 02/24/2014 1755    ----------------------------------------------------------------------------------------------------------------   Imaging results:   Dg Chest 2  View  02/24/2014   CLINICAL DATA:  80 year old female with weakness and nausea. Shortness breath. No chest pain. Initial encounter.  EXAM: CHEST  2 VIEW  COMPARISON:  01/31/2014 CT.  01/30/2014 chest x-ray.  FINDINGS: Sequential pacemaker enters from the left with leads in the region of the right atrium and right ventricle. Cardiomegaly.  Central pulmonary vascular prominence without frank pulmonary edema.  Left base subsegmental atelectasis.  No gross pneumothorax.  Calcified ectatic aorta.  Prior left shoulder replacement.  Marked right shoulder joint degenerative changes.  IMPRESSION: Central pulmonary vascular prominence without frank pulmonary edema.  Mild left  base subsegmental atelectasis.  Cardiomegaly with pacer in place.  Calcified tortuous aorta.   Electronically Signed   By: Bridgett Larsson M.D.   On: 02/24/2014 17:31   Dg Chest 2 View  01/30/2014   CLINICAL DATA:  Difficulty breathing  EXAM: CHEST  2 VIEW  COMPARISON:  December 08, 2012  FINDINGS: There is a degree of underlying emphysematous change. There is no edema or consolidation. Heart is mildly enlarged with pulmonary vascularity within normal limits. Pacemaker leads are attached to the right atrium and right ventricle. No pneumothorax. There is a total shoulder replacement on the left. Bones overall are osteoporotic.  IMPRESSION: Underlying emphysematous change. Mild cardiomegaly. No edema or consolidation.   Electronically Signed   By: Bretta Bang M.D.   On: 01/30/2014 19:14   Ct Angio Chest Pe W/cm &/or Wo Cm  01/31/2014   CLINICAL DATA:  Weakness shortness of breath x1 day. Initial evaluation.  EXAM: CT ANGIOGRAPHY CHEST WITH CONTRAST  TECHNIQUE: Multidetector CT imaging of the chest was performed using the standard protocol during bolus administration of intravenous contrast. Multiplanar CT image reconstructions and MIPs were obtained to evaluate the vascular anatomy.  CONTRAST:  OMNIPAQUE IOHEXOL 350 MG/ML SOLN  COMPARISON:  Chest x-ray 01/30/2014.  FINDINGS: Thoracic aorta normal caliber. No evidence of aneurysm or dissection. Atherosclerotic vascular changes are noted. Pulmonary arteries are normal. Cardiomegaly. Coronary artery disease. Cardiac pacer. Small pericardial effusion.  Shotty mediastinal lymph nodes.  Thoracic esophagus unremarkable.  Large airways are patent. Mild bibasilar atelectasis. No significant pleural effusion or pneumothorax.  Adrenals are unremarkable. Sludge and/or stones may be present in the gallbladder. Further evaluation with right upper quadrant ultrasound can be obtained.  Visualized thyroid unremarkable. Shotty axillary lymph nodes. Severe degenerative  changes thoracic spine.  Review of the MIP images confirms the above findings.  IMPRESSION: 1. No evidence of pulmonary embolus. 2. Coronary artery disease. 3. Small pericardial effusion. 4. Cardiomegaly.  Cardiac pacer is present. 5. Mild basilar subsegmental atelectasis. 6. Possible sludge and/or stones in the gallbladder. Further evaluation with right upper quadrant ultrasound can be obtained as needed.   Electronically Signed   By: Maisie Fus  Register   On: 01/31/2014 02:56    My personal review of ZOX:WRUEA rhythm at 61 beats per minute and a bundle branch block   Assessment & Plan  1. Dehydration, mild 2. Generalized weakness and inability to perform ADLs,confusion 3. Hypertension 4.renal insufficiency 5.depression with decreased appetite  Plan  IV fluidsx1 L Remeron Vitamin B12, TSH levels Change lisinopril to Norvasc PT/OT evaluation and treatment   DVT Prophylaxis Heparin  AM Labs Ordered, also please review Full Orders  Family Communication: Admission, patients condition and plan of care including tests being ordered have been discussed with the patient and sons who indicate understanding and agree with the plan and Code Status.  Code Status full  Disposition Plan:  unknown  Time spent in minutes : 34 minutes  Condition GUARDED   @SIGNATURE @

## 2014-02-24 NOTE — ED Notes (Signed)
Pt presents to department for evaluation of generalized weakness, confusion and SOB. Pt is alert and able to answer simple questions. Denies pain. Family concerned they aren't able to care for her anymore, possible NH placement.

## 2014-02-25 DIAGNOSIS — I10 Essential (primary) hypertension: Secondary | ICD-10-CM

## 2014-02-25 DIAGNOSIS — E860 Dehydration: Principal | ICD-10-CM

## 2014-02-25 DIAGNOSIS — E038 Other specified hypothyroidism: Secondary | ICD-10-CM

## 2014-02-25 DIAGNOSIS — R531 Weakness: Secondary | ICD-10-CM

## 2014-02-25 DIAGNOSIS — N183 Chronic kidney disease, stage 3 (moderate): Secondary | ICD-10-CM

## 2014-02-25 LAB — URINE CULTURE: Colony Count: >=100000

## 2014-02-25 LAB — BASIC METABOLIC PANEL
Anion gap: 10 (ref 5–15)
BUN: 16 mg/dL (ref 6–23)
CALCIUM: 9.4 mg/dL (ref 8.4–10.5)
CO2: 26 mEq/L (ref 19–32)
Chloride: 101 mEq/L (ref 96–112)
Creatinine, Ser: 1.39 mg/dL — ABNORMAL HIGH (ref 0.50–1.10)
GFR calc Af Amer: 36 mL/min — ABNORMAL LOW (ref >90)
GFR, EST NON AFRICAN AMERICAN: 31 mL/min — AB (ref >90)
GLUCOSE: 77 mg/dL (ref 70–99)
Potassium: 4.6 mEq/L (ref 3.7–5.3)
SODIUM: 137 meq/L (ref 137–147)

## 2014-02-25 LAB — T4, FREE: Free T4: 1.19 ng/dL (ref 0.80–1.80)

## 2014-02-25 LAB — VITAMIN B12: Vitamin B-12: 222 pg/mL (ref 211–911)

## 2014-02-25 MED ORDER — CETYLPYRIDINIUM CHLORIDE 0.05 % MT LIQD
7.0000 mL | Freq: Two times a day (BID) | OROMUCOSAL | Status: DC
  Administered 2014-02-25 – 2014-02-27 (×4): 7 mL via OROMUCOSAL

## 2014-02-25 MED ORDER — BOOST PLUS PO LIQD
237.0000 mL | Freq: Three times a day (TID) | ORAL | Status: DC
  Administered 2014-02-25 – 2014-02-27 (×5): 237 mL via ORAL
  Filled 2014-02-25 (×10): qty 237

## 2014-02-25 NOTE — Evaluation (Signed)
Occupational Therapy Evaluation Patient Details Name: Kelli BrillHazel J [name] MRN: [id number] DOB: 10-Apr-1916 Today's Date: 02/25/2014    History of Present Illness Kelli [name]  is a 80 y.o. female, with past medical history significant for hypertension, sick sinus syndrome status post pacemaker, and congestive heart failure presenting with few days history of nausea, decreased appetite, generalized weakness and inability to perform ADLs. Her history started 2-3 weeks ago with an admission in the hospital for weakness, she improved clinically and was discharged to followup with her primary medical doctor who started her on Lasix and Wellbutrin for depression. Since then her situation started deteriorating and she has been more confused than usual according to the sons with decreased appetite and weakness. Her PMD stopped the Lasix and Wellbutrin ,but no significant improvement. When she called today he advised that she comes to the emergency room for evaluation.   Clinical Impression   Prior to admission, pt was assisted for housekeeping and showering 4 days a week.  She was able to wash up and dress between visits from her aide and prepared all her meals from a seated position.  She ambulated with a RW.  Pt now presents with generalized weakness and impaired balance interfering with independence in ADL and ADL transfers.  Pt does not have 24 hour care at home and will need post acute rehab.  Will follow acutely.    Follow Up Recommendations  SNF    Equipment Recommendations       Recommendations for Other Services       Precautions / Restrictions Precautions Precautions: Fall Restrictions Weight Bearing Restrictions: No      Mobility Bed Mobility Pt up in chair Transfers Overall transfer level: Needs assistance   Transfers: Sit to/from Stand Sit to Stand: Min assist         General transfer comment: cues for hand placement and lift plus assist coming forward.    Balance Overall  balance assessment: Needs assistance Sitting-balance support: No upper extremity supported Sitting balance-Leahy Scale: Fair     Standing balance support: Single extremity supported;Bilateral upper extremity supported Standing balance-Leahy Scale: Poor                              ADL Overall ADL's : Needs assistance/impaired Eating/Feeding: Independent;Sitting   Grooming: Wash/dry hands;Min guard;Standing   Upper Body Bathing: Minimal assitance;Sitting   Lower Body Bathing: Minimal assistance;Sit to/from stand   Upper Body Dressing : Minimal assistance;Sitting   Lower Body Dressing: Minimal assistance;Sit to/from stand   Toilet Transfer: Minimal assistance;Ambulation;RW   Toileting- Clothing Manipulation and Hygiene: Supervision/safety;Sitting/lateral lean       Functional mobility during ADLs: Minimal assistance;Rolling walker       Vision                     Perception     Praxis      Pertinent Vitals/Pain Pain Assessment: No/denies pain     Hand Dominance Right   Extremity/Trunk Assessment Upper Extremity Assessment Upper Extremity Assessment: Generalized weakness   Lower Extremity Assessment Lower Extremity Assessment: Defer to PT evaluation       Communication Communication Communication: HOH (wears hearing aid)   Cognition Arousal/Alertness: Awake/alert Behavior During Therapy: WFL for tasks assessed/performed Overall Cognitive Status: Within Functional Limits for tasks assessed                     General Comments  Exercises       Shoulder Instructions      Home Living Family/patient expects to be discharged to:: Skilled nursing facility Living Arrangements: Alone Available Help at Discharge: Personal care attendant (3 hours a day/4 days a week) Type of Home: Apartment       Home Layout: One level               Home Equipment: Walker - 2 wheels;Shower seat;Hand held shower head           Prior Functioning/Environment Level of Independence: Needs assistance  Gait / Transfers Assistance Needed: ambulates with RW ADL's / Homemaking Assistance Needed: pt performs meal prep in sitting, can dress, personal care attendant helps with showering and housekeeping   Comments: PCA assisted with bathing, dressing, cooking, not running errands    OT Diagnosis: Generalized weakness   OT Problem List: Decreased activity tolerance;Decreased strength;Impaired balance (sitting and/or standing);Decreased knowledge of use of DME or AE   OT Treatment/Interventions: Self-care/ADL training;DME and/or AE instruction;Patient/family education    OT Goals(Current goals can be found in the care plan section) Acute Rehab OT Goals Patient Stated Goal: I would love to get back home OT Goal Formulation: With patient Time For Goal Achievement: 03/11/14 Potential to Achieve Goals: Good ADL Goals Pt Will Perform Grooming: with supervision;sitting Pt Will Perform Lower Body Bathing: with supervision;sit to/from stand Pt Will Perform Lower Body Dressing: with supervision;sit to/from stand Pt Will Transfer to Toilet: with supervision;ambulating;regular height toilet Pt Will Perform Toileting - Clothing Manipulation and hygiene: with supervision;sit to/from stand  OT Frequency: Min 2X/week   Barriers to D/C:            Co-evaluation              End of Session Equipment Utilized During Treatment: Rolling walker;Gait belt Nurse Communication:  (aware call button does not work)  Activity Tolerance: Patient limited by fatigue Patient left: in chair;with call bell/phone within reach (bed moved so pt could reach call button,called maintenance)   Time: [id number] OT Time Calculation (min): 24 min Charges:  OT General Charges $OT Visit: 1 Procedure OT Evaluation $Initial OT Evaluation Tier I: 1 Procedure OT Treatments $Self Care/Home Management : 8-22 mins G-Codes:    Evern BioMayberry, Flannery Cavallero  Lynn 02/25/2014, 1:08 PM  [phone]

## 2014-02-25 NOTE — Plan of Care (Signed)
Problem: Phase I Progression Outcomes Goal: Pain controlled with appropriate interventions Outcome: Progressing     

## 2014-02-25 NOTE — Progress Notes (Signed)
[facility]                                                                              Patient Demographics  Kelli [name], is a 80 y.o. female, DOB - 12/06/1915, ONG:[id number]  Admit date - 02/24/2014   Admitting Physician Carron CurieAli Hijazi, MD  Outpatient Primary MD for the patient is Carylon PerchesFAGAN,ROY, MD  LOS - 1   Chief Complaint  Patient presents with  . Weakness  . Altered Mental Status  . Shortness of Breath      HPI on 02/24/2014 Kelli [name] is a 80 y.o. female, with past medical history significant for hypertension, sick sinus syndrome status post pacemaker, and congestive heart failure presented with few days history of nausea, decreased appetite, generalized weakness and inability to perform ADLs. Her history started 2-3 weeks ago with an admission in the hospital for weakness, she improved clinically and was discharged to followup with her primary medical doctor who started her on Lasix and Wellbutrin for depression. Since then her situation started deteriorating and she has been more confused than usual according to the sons with decreased appetite and weakness. Her PMD stopped the Lasix and Wellbutrin ,but no significant improvement. When she called before admission he advised that she come to the emergency room for evaluation.  Assessment & Plan   Dehydration with hyponaremia and hypochloremia -resolved,continue IV fluids -continue to monitor BMP  Generalized weakness with inability to perform ADLs -pending PT and OT consults for evaluation and treatment -TSH 5.84, however will check FT4 -Vitamin B12 222, will give supplementation  Confusion -appears to have resolved, will speak to patient's Familyregarding her baseline  Hypertension -continue metoprolol and amlodipine, lisinopril held  CKD,stage III-IV -creatinine appears to be improving we'll continue to monitor BMP  Depression with decreased appetite -continue Remeron  Hypothyroidism -TSH 5.84, will check  FT4 -Continue Synthroid  Code Status: Full  Family Communication: None at bedside  Disposition Plan: Admitted, will likely discharge   Time Spent in minutes   30 minutes  Procedures  None  Consults   None  DVT Prophylaxis  heparin  Lab Results  Component Value Date   PLT 189 02/24/2014    Medications  Scheduled Meds: . amLODipine  10 mg Oral Daily  . amLODipine  5 mg Oral Daily  . antiseptic oral rinse  7 mL Mouth Rinse BID  . aspirin EC  81 mg Oral Daily  . heparin  5,000 Units Subcutaneous 3 times per day  . levothyroxine  88 mcg Oral QAC breakfast  . metoprolol succinate  25 mg Oral Daily  . mirtazapine  7.5 mg Oral QHS  . pantoprazole  40 mg Oral Daily   Continuous Infusions: . sodium chloride 75 mL/hr at 02/24/14 2213   PRN Meds:.acetaminophen **OR** acetaminophen, ALPRAZolam, colchicine, HYDROcodone-acetaminophen, ondansetron **OR** ondansetron (ZOFRAN) IV, zolpidem  Antibiotics  None  Anti-infectives    None      Subjective:   Kelli [name] seen and examined today.  Patient states she just feels tired and weak. She denies any chest pain, shortness of breath, abdominal pain.    Objective:   Filed Vitals:   02/24/14 2119 02/24/14 2134 02/24/14 2140 02/25/14  0526  BP: 148/51 165/64  145/57  Pulse: 98 61  60  Temp:  98 F (36.7 C)  97.7 F (36.5 C)  TempSrc:  Oral  Oral  Resp: 14 16  16   Height:   5\' 2"  (1.575 m)   Weight:   68.2 kg (150 lb 5.7 oz)   SpO2: 98% 94%  97%    Wt Readings from Last 3 Encounters:  02/24/14 68.2 kg (150 lb 5.7 oz)  01/30/14 72.394 kg (159 lb 9.6 oz)  12/30/13 73.301 kg (161 lb 9.6 oz)     Intake/Output Summary (Last 24 hours) at 02/25/14 1026 Last data filed at 02/25/14 0940  Gross per 24 hour  Intake 903.75 ml  Output      0 ml  Net 903.75 ml    Exam  General: Well developed, well nourished, NAD, appears stated age  HEENT: NCAT, mucous membranes moist.   Cardiovascular: S1 S2 auscultated, no rubs,  murmurs or gallops. Regular rate and rhythm.  Respiratory: Clear to auscultation bilaterally with equal chest rise  Abdomen: Soft, nontender, nondistended, + bowel sounds  Extremities: warm dry without cyanosis clubbing   Neuro: AAOx3, no focal deficits  Psych: Depressed mood and affect  Data Review   Micro Results No results found for this or any previous visit (from the past 240 hour(s)).  Radiology Reports Dg Chest 2 View  02/24/2014   CLINICAL DATA:  80 year old female with weakness and nausea. Shortness breath. No chest pain. Initial encounter.  EXAM: CHEST  2 VIEW  COMPARISON:  01/31/2014 CT.  01/30/2014 chest x-ray.  FINDINGS: Sequential pacemaker enters from the left with leads in the region of the right atrium and right ventricle. Cardiomegaly.  Central pulmonary vascular prominence without frank pulmonary edema.  Left base subsegmental atelectasis.  No gross pneumothorax.  Calcified ectatic aorta.  Prior left shoulder replacement.  Marked right shoulder joint degenerative changes.  IMPRESSION: Central pulmonary vascular prominence without frank pulmonary edema.  Mild left base subsegmental atelectasis.  Cardiomegaly with pacer in place.  Calcified tortuous aorta.   Electronically Signed   By: Bridgett LarssonSteve  Olson M.D.   On: 02/24/2014 17:31   Dg Chest 2 View  01/30/2014   CLINICAL DATA:  Difficulty breathing  EXAM: CHEST  2 VIEW  COMPARISON:  December 08, 2012  FINDINGS: There is a degree of underlying emphysematous change. There is no edema or consolidation. Heart is mildly enlarged with pulmonary vascularity within normal limits. Pacemaker leads are attached to the right atrium and right ventricle. No pneumothorax. There is a total shoulder replacement on the left. Bones overall are osteoporotic.  IMPRESSION: Underlying emphysematous change. Mild cardiomegaly. No edema or consolidation.   Electronically Signed   By: Bretta BangWilliam  Woodruff M.D.   On: 01/30/2014 19:14   Ct Angio Chest Pe W/cm &/or  Wo Cm  01/31/2014   CLINICAL DATA:  Weakness shortness of breath x1 day. Initial evaluation.  EXAM: CT ANGIOGRAPHY CHEST WITH CONTRAST  TECHNIQUE: Multidetector CT imaging of the chest was performed using the standard protocol during bolus administration of intravenous contrast. Multiplanar CT image reconstructions and MIPs were obtained to evaluate the vascular anatomy.  CONTRAST:  100mL OMNIPAQUE IOHEXOL 350 MG/ML SOLN  COMPARISON:  Chest x-ray 01/30/2014.  FINDINGS: Thoracic aorta normal caliber. No evidence of aneurysm or dissection. Atherosclerotic vascular changes are noted. Pulmonary arteries are normal. Cardiomegaly. Coronary artery disease. Cardiac pacer. Small pericardial effusion.  Shotty mediastinal lymph nodes.  Thoracic esophagus unremarkable.  Large airways are  patent. Mild bibasilar atelectasis. No significant pleural effusion or pneumothorax.  Adrenals are unremarkable. Sludge and/or stones may be present in the gallbladder. Further evaluation with right upper quadrant ultrasound can be obtained.  Visualized thyroid unremarkable. Shotty axillary lymph nodes. Severe degenerative changes thoracic spine.  Review of the MIP images confirms the above findings.  IMPRESSION: 1. No evidence of pulmonary embolus. 2. Coronary artery disease. 3. Small pericardial effusion. 4. Cardiomegaly.  Cardiac pacer is present. 5. Mild basilar subsegmental atelectasis. 6. Possible sludge and/or stones in the gallbladder. Further evaluation with right upper quadrant ultrasound can be obtained as needed.   Electronically Signed   By: Maisie Fus  Register   On: 01/31/2014 02:56    CBC  Recent Labs Lab 02/24/14 1627  WBC 5.8  HGB 12.8  HCT 38.5  PLT 189  MCV 89.5  MCH 29.8  MCHC 33.2  RDW 13.3  LYMPHSABS 0.9  MONOABS 0.6  EOSABS 0.2  BASOSABS 0.0    Chemistries   Recent Labs Lab 02/24/14 1627 02/25/14 0652  NA 130* 137  K 5.3 4.6  CL 93* 101  CO2 27 26  GLUCOSE 118* 77  BUN 18 16  CREATININE  1.48* 1.39*  CALCIUM 10.0 9.4  AST 15  --   ALT 6  --   ALKPHOS 77  --   BILITOT 0.2*  --    ------------------------------------------------------------------------------------------------------------------ estimated creatinine clearance is 21.4 mL/min (by C-G formula based on Cr of 1.39). ------------------------------------------------------------------------------------------------------------------ No results for input(s): HGBA1C in the last 72 hours. ------------------------------------------------------------------------------------------------------------------ No results for input(s): CHOL, HDL, LDLCALC, TRIG, CHOLHDL, LDLDIRECT in the last 72 hours. ------------------------------------------------------------------------------------------------------------------  Recent Labs  02/24/14 2229  TSH 5.840*   ------------------------------------------------------------------------------------------------------------------  Recent Labs  02/24/14 2229  VITAMINB12 222    Coagulation profile No results for input(s): INR, PROTIME in the last 168 hours.  No results for input(s): DDIMER in the last 72 hours.  Cardiac Enzymes  Recent Labs Lab 02/24/14 1627  TROPONINI <0.30   ------------------------------------------------------------------------------------------------------------------ Invalid input(s): POCBNP    Anuj Summons D.O. on 02/25/2014 at 10:26 AM  Between 7am to 7pm - Pager - [phone]  After 7pm go to [email or web address] - password [email or web address]  And look for the night coverage person covering for me after hours  [facility] Office  [phone]

## 2014-02-25 NOTE — Evaluation (Signed)
Physical Therapy Evaluation Patient Details Name: Kelli BrillHazel J [name] MRN: [id number] DOB: 10-14-1915 Today's Date: 02/25/2014   History of Present Illness  Kelli [name]  is a [age] y.o. female, with past medical history significant for hypertension, sick sinus syndrome status post pacemaker, and congestive heart failure presenting with few days history of nausea, decreased appetite, generalized weakness and inability to perform ADLs. Her history started 2-3 weeks ago with an admission in the hospital for weakness, she improved clinically and was discharged to followup with her primary medical doctor who started her on Lasix and Wellbutrin for depression. Since then her situation started deteriorating and she has been more confused than usual according to the sons with decreased appetite and weakness. Her PMD stopped the Lasix and Wellbutrin ,but no significant improvement. When she called today he advised that she comes to the emergency room for evaluation.  Clinical Impression  Pt admitted with/for general weakness and FTT.  Pt currently limited functionally due to the problems listed. ( See problems list.)   Pt will benefit from PT to maximize function and safety in order to get ready for next venue listed below.     Follow Up Recommendations SNF    Equipment Recommendations  None recommended by PT;Other (comment) (TBA)    Recommendations for Other Services       Precautions / Restrictions Precautions Precautions: Fall Restrictions Weight Bearing Restrictions: No      Mobility  Bed Mobility Overal bed mobility: Needs Assistance Bed Mobility: Supine to Sit     Supine to sit: Min assist     General bed mobility comments: scooted to EOB with rail and building good momentum  Transfers Overall transfer level: Needs assistance   Transfers: Sit to/from Stand Sit to Stand: Min assist         General transfer comment: cues for hand placement and lift plus assist coming  forward.  Ambulation/Gait Ambulation/Gait assistance: Min guard Ambulation Distance (Feet): 22 Feet (times 2 to/from toilet with RW) Assistive device: Rolling walker (2 wheeled) Gait Pattern/deviations: Step-through pattern;Trunk flexed;Decreased step length - right;Decreased step length - left Gait velocity: slower   General Gait Details: mildly unsteady short steps with decreased knee flexion  Stairs            Wheelchair Mobility    Modified Rankin (Stroke Patients Only)       Balance Overall balance assessment: Needs assistance Sitting-balance support: No upper extremity supported Sitting balance-Leahy Scale: Fair     Standing balance support: Single extremity supported;Bilateral upper extremity supported Standing balance-Leahy Scale: Poor                               Pertinent Vitals/Pain Pain Assessment: No/denies pain    Home Living Family/patient expects to be discharged to:: Skilled nursing facility Living Arrangements: Alone Available Help at Discharge: Personal care attendant (3 hours /day) Type of Home: Apartment       Home Layout: One level Home Equipment: Walker - 2 wheels      Prior Function Level of Independence: Independent with assistive device(s)         Comments: PCA assisted with bathing, dressing, cooking, not running errands     Hand Dominance        Extremity/Trunk Assessment   Upper Extremity Assessment: Defer to OT evaluation           Lower Extremity Assessment: Generalized weakness  Communication   Communication: No difficulties  Cognition Arousal/Alertness: Awake/alert Behavior During Therapy: WFL for tasks assessed/performed Overall Cognitive Status: Within Functional Limits for tasks assessed                      General Comments      Exercises        Assessment/Plan    PT Assessment Patient needs continued PT services  PT Diagnosis Difficulty walking;Generalized  weakness   PT Problem List Decreased strength;Decreased activity tolerance;Decreased balance;Decreased mobility;Decreased knowledge of use of DME  PT Treatment Interventions DME instruction;Gait training;Functional mobility training;Therapeutic exercise;Therapeutic activities;Balance training;Patient/family education   PT Goals (Current goals can be found in the Care Plan section) Acute Rehab PT Goals Patient Stated Goal: I would love to get back home PT Goal Formulation: With patient Time For Goal Achievement: 03/11/14 Potential to Achieve Goals: Good    Frequency Min 3X/week   Barriers to discharge        Co-evaluation               End of Session   Activity Tolerance: Patient tolerated treatment well Patient left: in chair;with call bell/phone within reach Nurse Communication: Mobility status         Time: 1030-1108 PT Time Calculation (min): 38 min   Charges:   PT Evaluation $Initial PT Evaluation Tier I: 1 Procedure PT Treatments $Gait Training: 8-22 mins $Therapeutic Activity: 8-22 mins   PT G Codes:          Kelli [name], Kelli [name] 02/25/2014, 12:00 PM 02/25/2014  Kelli [name], PT [phone]  (pager)

## 2014-02-25 NOTE — Progress Notes (Signed)
Utilization review completed.  

## 2014-02-25 NOTE — Plan of Care (Signed)
Problem: Phase I Progression Outcomes Goal: Voiding-avoid urinary catheter unless indicated Outcome: Progressing     

## 2014-02-25 NOTE — Plan of Care (Signed)
Problem: Consults Goal: Skin Care Protocol Initiated - if Braden Score 18 or less If consults are not indicated, leave blank or document N/A Outcome: Progressing     

## 2014-02-25 NOTE — Progress Notes (Signed)
Clarified amlodipine dosage with MD Catha GosselinMikhail. Orders to give 10PM amlodipine 10 mg only.

## 2014-02-25 NOTE — Progress Notes (Signed)
INITIAL NUTRITION ASSESSMENT  DOCUMENTATION CODES Per approved criteria  -Not Applicable   INTERVENTION: Boost Plus TID  NUTRITION DIAGNOSIS: Inadequate oral intake related to decreased appetite as evidenced by PO: 50%, diet hx PTA.   Goal: Pt will meet >90% of estimated nutritional needs  Monitor:  PO/suplement intake, labs, weight changes, I/O's  Reason for Assessment: MST=2  80 y.o. female  Admitting Dx: <principal problem not specified>  Kelli [name] is a 80 y.o. female, with past medical history significant for hypertension, sick sinus syndrome status post pacemaker, and congestive heart failure presented with few days history of nausea, decreased appetite, generalized weakness and inability to perform ADLs. Her history started 2-3 weeks ago with an admission in the hospital for weakness, she improved clinically and was discharged to followup with her primary medical doctor who started her on Lasix and Wellbutrin for depression. Since then her situation started deteriorating and she has been more confused than usual according to the sons with decreased appetite and weakness. Her PMD stopped the Lasix and Wellbutrin ,but no significant improvement.  ASSESSMENT: Pt admits to poor appetite over the past 2 weeks. She reports that this has not improved since being admitted to the hospital. Documented meal intake 50%. Pt reports she ate half of a bagel and approximately 1/3 of her eggs at breakfast this morning. She endorses wt loss over the past month. She reveals that she has lost approximately 11# since starting on lasix. Documented wt hx reveals a5.7% wt loss x 1 month. She feels that weight loss is a combination of lasix and poor intake.  She reports she still cooks and prepares meals at home. She has support from a aide for 3 hours per day and also has two sons who live closeby who are very supportive.  She reveals that she started drinking Boost at home and would like to  continue this during hospitalization. Reviewed supplement options with her and she would like to continue with Boost. Reinforced importance of good PO and supplement intake to promote healing process. Pt very grateful for RD visit.  Labs reviewed. Creat: 1.39.  Nutrition Focused Physical Exam:  Subcutaneous Fat:  Orbital Region: WDL Upper Arm Region: mild depletion Thoracic and Lumbar Region: WDL  Muscle:  Temple Region: WDL Clavicle Bone Region: WDL Clavicle and Acromion Bone Region: WDL Scapular Bone Region: WDL Dorsal Hand: mild depletion Patellar Region: WDL Anterior Thigh Region: WDL Posterior Calf Region: WDL  Edema: none present  Suspect muscle loss is due to advanced age.   Height: Ht Readings from Last 1 Encounters:  02/24/14 5\' 2"  (1.575 m)    Weight: Wt Readings from Last 1 Encounters:  02/24/14 150 lb 5.7 oz (68.2 kg)    Ideal Body Weight: 110#  % Ideal Body Weight: 136%  Wt Readings from Last 10 Encounters:  02/24/14 150 lb 5.7 oz (68.2 kg)  01/30/14 159 lb 9.6 oz (72.394 kg)  12/30/13 161 lb 9.6 oz (73.301 kg)  07/15/13 160 lb (72.576 kg)  09/24/12 168 lb (76.204 kg)  01/01/12 164 lb (74.39 kg)  09/12/11 162 lb 12.8 oz (73.846 kg)  08/16/10 158 lb (71.668 kg)  12/02/09 161 lb (73.029 kg)    Usual Body Weight: 160#  % Usual Body Weight: 94%  BMI:  Body mass index is 27.49 kg/(m^2). Overweight  Estimated Nutritional Needs: Kcal: 1500-1700 Protein: 82-92 grams Fluid: 1.5-1.7 L  Skin: Intact  Diet Order: Diet Heart  EDUCATION NEEDS: -Education needs addressed   Intake/Output Summary (  Last 24 hours) at 02/25/14 1052 Last data filed at 02/25/14 0940  Gross per 24 hour  Intake 903.75 ml  Output      0 ml  Net 903.75 ml    Last BM: 02/24/14  Labs:   Recent Labs Lab 02/24/14 1627 02/25/14 0652  NA 130* 137  K 5.3 4.6  CL 93* 101  CO2 27 26  BUN 18 16  CREATININE 1.48* 1.39*  CALCIUM 10.0 9.4  GLUCOSE 118* 77    CBG  (last 3)  No results for input(s): GLUCAP in the last 72 hours.  Scheduled Meds: . amLODipine  10 mg Oral Daily  . antiseptic oral rinse  7 mL Mouth Rinse BID  . aspirin EC  81 mg Oral Daily  . heparin  5,000 Units Subcutaneous 3 times per day  . levothyroxine  88 mcg Oral QAC breakfast  . metoprolol succinate  25 mg Oral Daily  . mirtazapine  7.5 mg Oral QHS  . pantoprazole  40 mg Oral Daily    Continuous Infusions: . sodium chloride 75 mL/hr at 02/24/14 2213    Past Medical History  Diagnosis Date  . Vasovagal syncope   . Hypertension   . Pacemaker     [employer] DDD  . Carotid artery disease   . Hypothyroidism   . GERD (gastroesophageal reflux disease)   . CHF (congestive heart failure)     diastolic in nature  . Sleep apnea   . Supraventricular arrhythmia     Past Surgical History  Procedure Laterality Date  . Pacemaker insertion    . Back surgery      Kelli [name], RD, LDN Pager: [phone] After hours Pager: [phone]

## 2014-02-25 NOTE — Plan of Care (Signed)
Problem: Consults Goal: Diabetes Guidelines if Diabetic/Glucose > 140 If diabetic or lab glucose is > 140 mg/dl - Initiate Diabetes/Hyperglycemia Guidelines & Document Interventions  Outcome: Not Applicable Date Met:  02/25/14     

## 2014-02-26 DIAGNOSIS — I5032 Chronic diastolic (congestive) heart failure: Secondary | ICD-10-CM

## 2014-02-26 LAB — BASIC METABOLIC PANEL
ANION GAP: 9 (ref 5–15)
BUN: 18 mg/dL (ref 6–23)
CALCIUM: 9.4 mg/dL (ref 8.4–10.5)
CO2: 25 mEq/L (ref 19–32)
CREATININE: 1.54 mg/dL — AB (ref 0.50–1.10)
Chloride: 103 mEq/L (ref 96–112)
GFR calc Af Amer: 32 mL/min — ABNORMAL LOW (ref >90)
GFR calc non Af Amer: 27 mL/min — ABNORMAL LOW (ref >90)
GLUCOSE: 108 mg/dL — AB (ref 70–99)
Potassium: 5 mEq/L (ref 3.7–5.3)
Sodium: 137 mEq/L (ref 137–147)

## 2014-02-26 LAB — CBC
HCT: 35.2 % — ABNORMAL LOW (ref 36.0–46.0)
Hemoglobin: 11.6 g/dL — ABNORMAL LOW (ref 12.0–15.0)
MCH: 30 pg (ref 26.0–34.0)
MCHC: 33 g/dL (ref 30.0–36.0)
MCV: 91 fL (ref 78.0–100.0)
PLATELETS: 161 10*3/uL (ref 150–400)
RBC: 3.87 MIL/uL (ref 3.87–5.11)
RDW: 13.6 % (ref 11.5–15.5)
WBC: 4.5 10*3/uL (ref 4.0–10.5)

## 2014-02-26 MED ORDER — CYANOCOBALAMIN 1000 MCG/ML IJ SOLN
1000.0000 ug | Freq: Once | INTRAMUSCULAR | Status: AC
  Administered 2014-02-26: 1000 ug via INTRAMUSCULAR
  Filled 2014-02-26: qty 1

## 2014-02-26 NOTE — Plan of Care (Signed)
Problem: Phase II Progression Outcomes Goal: Progress activity as tolerated unless otherwise ordered Outcome: Adequate for Discharge Pt going to SNF  Goal: Discharge plan established Outcome: Completed/Met Date Met:  02/26/14 Goal: Obtain order to discontinue catheter if appropriate Outcome: Not Applicable Date Met:  96/75/91  Problem: Phase III Progression Outcomes Goal: Foley discontinued Outcome: Not Applicable Date Met:  63/84/66

## 2014-02-26 NOTE — Progress Notes (Signed)
[facility]                                                                              Patient Demographics  Kelli [name], is a 80 y.o. female, DOB - October 11, 1915, ZOX:[id number]  Admit date - 02/24/2014   Admitting Physician Kelli CurieAli Hijazi, MD  Outpatient Primary MD for the patient is Kelli PerchesFAGAN,ROY, MD  LOS - 2   Chief Complaint  Patient presents with  . Weakness  . Altered Mental Status  . Shortness of Breath      HPI on 02/24/2014 Kelli [name] is a 80 y.o. female, with past medical history significant for hypertension, sick sinus syndrome status post pacemaker, and congestive heart failure presented with few days history of nausea, decreased appetite, generalized weakness and inability to perform ADLs. Her history started 2-3 weeks ago with an admission in the hospital for weakness, she improved clinically and was discharged to followup with her primary medical doctor who started her on Lasix and Wellbutrin for depression. Since then her situation started deteriorating and she has been more confused than usual according to the sons with decreased appetite and weakness. Her PMD stopped the Lasix and Wellbutrin ,but no significant improvement. When she called before admission he advised that she come to the emergency room for evaluation.  Assessment & Plan   Dehydration with hyponaremia and hypochloremia -Improved. -continue to monitor BMP -Will discontinue IVF as patient has a history of Diastolic CHF  Generalized weakness with inability to perform ADLs -pending PT and OT consults for evaluation and treatment -TSH 5.84, FT4 1.19 -Vitamin B12 222, will give supplementation  Confusion -appears to have resolved, will speak to patient's Familyregarding her baseline  Hypertension -continue metoprolol and amlodipine, lisinopril held  CKD,stage III-IV -creatinine appears to be stable  Depression with decreased appetite -continue Remeron  Hypothyroidism -TSH 5.84, FT4  1.19 -Continue Synthroid  Chronic diastolic CHF -Echocardiogram 02/09/2014: EF 65-70%, grade 1 diastolic dysfunction -Patient appears to be euvolemic at this time and not in exacerbation. -Will continue to monitor carefully as patient was given IV fluids for dehydration -continue to monitor daily weights, intake and output  Code Status: Full  Family Communication: None at bedside.  Spoke with son via phone.  Disposition Plan: Admitted, will likely discharge 11/6 to SNF  Time Spent in minutes   25 minutes  Procedures  None  Consults   None  DVT Prophylaxis  heparin  Lab Results  Component Value Date   PLT 161 02/26/2014    Medications  Scheduled Meds: . amLODipine  10 mg Oral Daily  . antiseptic oral rinse  7 mL Mouth Rinse BID  . aspirin EC  81 mg Oral Daily  . heparin  5,000 Units Subcutaneous 3 times per day  . lactose free nutrition  237 mL Oral TID WC  . levothyroxine  88 mcg Oral QAC breakfast  . metoprolol succinate  25 mg Oral Daily  . mirtazapine  7.5 mg Oral QHS  . pantoprazole  40 mg Oral Daily   Continuous Infusions:   PRN Meds:.acetaminophen **OR** acetaminophen, ALPRAZolam, colchicine, HYDROcodone-acetaminophen, ondansetron **OR** ondansetron (ZOFRAN) IV, zolpidem  Antibiotics  None  Anti-infectives    None  Subjective:   Kelli [name] seen and examined today.  Patient continues to feel tired and week.  States she was able to eat some yesterday.  Denies any chest pain, shortness of breath, abdominal pain.    Objective:   Filed Vitals:   02/25/14 1111 02/25/14 1431 02/25/14 2142 02/26/14 0545  BP: 154/54 133/45 147/51 147/39  Pulse:  59 62 63  Temp:  97.7 F (36.5 C) 98.5 F (36.9 C) 98.2 F (36.8 C)  TempSrc:  Oral  Oral  Resp:  14 18 18   Height:      Weight:      SpO2:  95% 94% 95%    Wt Readings from Last 3 Encounters:  02/24/14 68.2 kg (150 lb 5.7 oz)  01/30/14 72.394 kg (159 lb 9.6 oz)  12/30/13 73.301 kg (161 lb 9.6  oz)     Intake/Output Summary (Last 24 hours) at 02/26/14 1028 Last data filed at 02/26/14 0900  Gross per 24 hour  Intake 2383.75 ml  Output      0 ml  Net 2383.75 ml    Exam  General: Well developed, well nourished, NAD, appears stated age  HEENT: NCAT, mucous membranes moist.   Cardiovascular: S1 S2 auscultated, no rubs, murmurs or gallops. Regular rate and rhythm.  Respiratory: Clear to auscultation bilaterally with equal chest rise  Abdomen: Soft, nontender, nondistended, + bowel sounds  Extremities: warm dry without cyanosis clubbing   Neuro: AAOx3, no focal deficits  Psych: Depressed mood and affect  Data Review   Micro Results Recent Results (from the past 240 hour(s))  Urine culture     Status: None   Collection Time: 02/24/14  5:55 PM  Result Value Ref Range Status   Specimen Description URINE, CLEAN CATCH  Final   Special Requests NONE  Final   Culture  Setup Time   Final    02/24/2014 22:41 Performed at [facility] Count   Final    >=100,000 COLONIES/ML Performed at [facility]    Culture   Final    Multiple bacterial morphotypes present, none predominant. Suggest appropriate recollection if clinically indicated. Performed at [facility]    Report Status 02/25/2014 FINAL  Final    Radiology Reports Dg Chest 2 View  02/24/2014   CLINICAL DATA:  80 year old female with weakness and nausea. Shortness breath. No chest pain. Initial encounter.  EXAM: CHEST  2 VIEW  COMPARISON:  01/31/2014 CT.  01/30/2014 chest x-ray.  FINDINGS: Sequential pacemaker enters from the left with leads in the region of the right atrium and right ventricle. Cardiomegaly.  Central pulmonary vascular prominence without frank pulmonary edema.  Left base subsegmental atelectasis.  No gross pneumothorax.  Calcified ectatic aorta.  Prior left shoulder replacement.  Marked right shoulder joint degenerative changes.  IMPRESSION: Central pulmonary  vascular prominence without frank pulmonary edema.  Mild left base subsegmental atelectasis.  Cardiomegaly with pacer in place.  Calcified tortuous aorta.   Electronically Signed   By: Kelli [name] M.D.   On: 02/24/2014 17:31   Dg Chest 2 View  01/30/2014   CLINICAL DATA:  Difficulty breathing  EXAM: CHEST  2 VIEW  COMPARISON:  December 08, 2012  FINDINGS: There is a degree of underlying emphysematous change. There is no edema or consolidation. Heart is mildly enlarged with pulmonary vascularity within normal limits. Pacemaker leads are attached to the right atrium and right ventricle. No pneumothorax. There is a total shoulder replacement on the  left. Bones overall are osteoporotic.  IMPRESSION: Underlying emphysematous change. Mild cardiomegaly. No edema or consolidation.   Electronically Signed   By: Bretta BangWilliam  Woodruff M.D.   On: 01/30/2014 19:14   Ct Angio Chest Pe W/cm &/or Wo Cm  01/31/2014   CLINICAL DATA:  Weakness shortness of breath x1 day. Initial evaluation.  EXAM: CT ANGIOGRAPHY CHEST WITH CONTRAST  TECHNIQUE: Multidetector CT imaging of the chest was performed using the standard protocol during bolus administration of intravenous contrast. Multiplanar CT image reconstructions and MIPs were obtained to evaluate the vascular anatomy.  CONTRAST:  100mL OMNIPAQUE IOHEXOL 350 MG/ML SOLN  COMPARISON:  Chest x-ray 01/30/2014.  FINDINGS: Thoracic aorta normal caliber. No evidence of aneurysm or dissection. Atherosclerotic vascular changes are noted. Pulmonary arteries are normal. Cardiomegaly. Coronary artery disease. Cardiac pacer. Small pericardial effusion.  Shotty mediastinal lymph nodes.  Thoracic esophagus unremarkable.  Large airways are patent. Mild bibasilar atelectasis. No significant pleural effusion or pneumothorax.  Adrenals are unremarkable. Sludge and/or stones may be present in the gallbladder. Further evaluation with right upper quadrant ultrasound can be obtained.  Visualized thyroid  unremarkable. Shotty axillary lymph nodes. Severe degenerative changes thoracic spine.  Review of the MIP images confirms the above findings.  IMPRESSION: 1. No evidence of pulmonary embolus. 2. Coronary artery disease. 3. Small pericardial effusion. 4. Cardiomegaly.  Cardiac pacer is present. 5. Mild basilar subsegmental atelectasis. 6. Possible sludge and/or stones in the gallbladder. Further evaluation with right upper quadrant ultrasound can be obtained as needed.   Electronically Signed   By: Maisie Fushomas  Register   On: 01/31/2014 02:56    CBC  Recent Labs Lab 02/24/14 1627 02/26/14 0605  WBC 5.8 4.5  HGB 12.8 11.6*  HCT 38.5 35.2*  PLT 189 161  MCV 89.5 91.0  MCH 29.8 30.0  MCHC 33.2 33.0  RDW 13.3 13.6  LYMPHSABS 0.9  --   MONOABS 0.6  --   EOSABS 0.2  --   BASOSABS 0.0  --     Chemistries   Recent Labs Lab 02/24/14 1627 02/25/14 0652 02/26/14 0605  NA 130* 137 137  K 5.3 4.6 5.0  CL 93* 101 103  CO2 27 26 25   GLUCOSE 118* 77 108*  BUN 18 16 18   CREATININE 1.48* 1.39* 1.54*  CALCIUM 10.0 9.4 9.4  AST 15  --   --   ALT 6  --   --   ALKPHOS 77  --   --   BILITOT 0.2*  --   --    ------------------------------------------------------------------------------------------------------------------ estimated creatinine clearance is 19.3 mL/min (by C-G formula based on Cr of 1.54). ------------------------------------------------------------------------------------------------------------------ No results for input(s): HGBA1C in the last 72 hours. ------------------------------------------------------------------------------------------------------------------ No results for input(s): CHOL, HDL, LDLCALC, TRIG, CHOLHDL, LDLDIRECT in the last 72 hours. ------------------------------------------------------------------------------------------------------------------  Recent Labs  02/24/14 2229  TSH 5.840*    ------------------------------------------------------------------------------------------------------------------  Recent Labs  02/24/14 2229  VITAMINB12 222    Coagulation profile No results for input(s): INR, PROTIME in the last 168 hours.  No results for input(s): DDIMER in the last 72 hours.  Cardiac Enzymes  Recent Labs Lab 02/24/14 1627  TROPONINI <0.30   ------------------------------------------------------------------------------------------------------------------ Invalid input(s): POCBNP    Braleigh Massoud D.O. on 02/26/2014 at 10:28 AM  Between 7am to 7pm - Pager - [phone]  After 7pm go to [email or web address] - password [email or web address]  And look for the night coverage person covering for me after hours  [facility] Office  [phone]

## 2014-02-27 MED ORDER — LISINOPRIL 20 MG PO TABS
20.0000 mg | ORAL_TABLET | Freq: Every day | ORAL | Status: DC
  Filled 2014-02-27: qty 1

## 2014-02-27 MED ORDER — CETYLPYRIDINIUM CHLORIDE 0.05 % MT LIQD: 7.0000 mL | Freq: Two times a day (BID) | OROMUCOSAL | Status: AC

## 2014-02-27 MED ORDER — MIRTAZAPINE 7.5 MG PO TABS: 7.5000 mg | ORAL_TABLET | Freq: Every day | ORAL | Status: AC

## 2014-02-27 MED ORDER — BOOST PLUS PO LIQD: 237.0000 mL | Freq: Three times a day (TID) | ORAL | Status: AC

## 2014-02-27 MED ORDER — ALPRAZOLAM 0.25 MG PO TABS: 0.1250 mg | ORAL_TABLET | Freq: Two times a day (BID) | ORAL | Status: AC | PRN

## 2014-02-27 NOTE — Discharge Summary (Addendum)
Physician Discharge Summary  Kelli [name] ZOX:[id number] DOB: [birth year] DOA: 02/24/2014  PCP: Carylon Perches, MD  Admit date: 02/24/2014 Discharge date: 02/27/2014  Time spent: 45 minutes  Recommendations for Outpatient Follow-up:  Patient will be discharged to skilled nursing facility.  She is to continue physical and occupational therapy as recommended by the facility. Patient to continue taking her medications as prescribed. She should follow a heart healthy diet with 1800 mL fluid restriction per day.  Patient will need follow-up with her primary care physician within 1-2 weeks of discharge.  Discharge Diagnoses:  Dehydration with hyponatremia and hypochloremia Generalized weakness with inability to perform daily activities Confusion Hypertension CKD, stage 3-4 Depression with decreased appetite Hypothyroidism Chronic diastolic CHF  Discharge Condition: stable  Diet recommendation: heart healthy  Filed Weights   02/24/14 1623 02/24/14 2140  Weight: 72.576 kg (160 lb) 68.2 kg (150 lb 5.7 oz)    History of present illness:  on 02/24/2014 Kelli [name] is a 80 y.o. female, with past medical history significant for hypertension, sick sinus syndrome status post pacemaker, and congestive heart failure presented with few days history of nausea, decreased appetite, generalized weakness and inability to perform ADLs. Her history started 2-3 weeks ago with an admission in the hospital for weakness, she improved clinically and was discharged to followup with her primary medical doctor who started her on Lasix and Wellbutrin for depression. Since then her situation started deteriorating and she has been more confused than usual according to the sons with decreased appetite and weakness. Her PMD stopped the Lasix and Wellbutrin ,but no significant improvement. When she called before admission he advised that she come to the emergency room for evaluation.  Hospital Course:  Dehydration with  hyponatremia and hypochloremia -Improved with IVF -continue to monitor BMP -IVF discontinued as patient has a history of Diastolic CHF  Generalized weakness with inability to perform ADLs -pending PT and OT consults for evaluation and treatment -TSH 5.84, FT4 1.19 -Vitamin B12 222, B12 injection given  Confusion -appears to have resolved -spoke with family, patient is back to her baseline.  Hypertension -continue metoprolol and amlodipine, lisinopril held during hospital course however will be restarted at discharge  CKD,stage III-IV -creatinine appears to be stable  Depression with decreased appetite -continue Remeron and nutritional supplements -appetite improving  Hypothyroidism -TSH 5.84, FT4 1.19 -Continue Synthroid  Chronic diastolic CHF -Echocardiogram 02/09/2014: EF 65-70%, grade 1 diastolic dysfunction -Patient appears to be euvolemic at this time and not in exacerbation. -Monitored carefully as patient was given IV fluids for dehydration -continue to monitor daily weights, intake and output  Procedures:  none  Consultations:  none  Discharge Exam: Filed Vitals:   02/27/14 0957  BP: 113/39  Pulse: 66  Temp:   Resp:      General: Well developed, well nourished, NAD, appears stated age  HEENT: NCAT, mucous membranes moist.  Cardiovascular: S1 S2 auscultated, no rubs, murmurs or gallops. Regular rate and rhythm.  Respiratory: Clear to auscultation bilaterally with equal chest rise  Abdomen: Soft, nontender, nondistended, + bowel sounds  Extremities: warm dry without cyanosis clubbing  Neuro: AAOx3, no focal deficits  Psych: appropriate mood and affect  Discharge Instructions      Discharge Instructions    Discharge instructions    Complete by:  As directed   Patient will be discharged to skilled nursing facility.  She is to continue physical and occupational therapy as recommended by the facility. Patient to continue taking her  medications as prescribed.  She should follow a heart healthy diet with 1800 mL fluid restriction per day.  Patient will need follow-up with her primary care physician within 1-2 weeks of discharge.            Medication List    TAKE these medications        ALPRAZolam 0.25 MG tablet  Commonly known as:  XANAX  Take 0.125-0.25 mg by mouth 2 (two) times daily as needed for anxiety.     amLODipine 10 MG tablet  Commonly known as:  NORVASC  Take 10 mg by mouth daily.     antiseptic oral rinse 0.05 % Liqd solution  Commonly known as:  CPC / CETYLPYRIDINIUM CHLORIDE 0.05%  7 mLs by Mouth Rinse route 2 (two) times daily.     aspirin 81 MG tablet  Take 81 mg by mouth daily.     colchicine 0.6 MG tablet  Take 0.6 mg by mouth daily as needed (Gout).     lactose free nutrition Liqd  Take 237 mLs by mouth 3 (three) times daily with meals.     levothyroxine 88 MCG tablet  Commonly known as:  SYNTHROID, LEVOTHROID  Take 88 mcg by mouth daily before breakfast.     lisinopril 20 MG tablet  Commonly known as:  PRINIVIL,ZESTRIL  Take 20 mg by mouth daily.     metoprolol succinate 25 MG 24 hr tablet  Commonly known as:  TOPROL-XL  Take 25 mg by mouth daily.     mirtazapine 7.5 MG tablet  Commonly known as:  REMERON  Take 1 tablet (7.5 mg total) by mouth at bedtime.     prochlorperazine 10 MG tablet  Commonly known as:  COMPAZINE  Take 10 mg by mouth every 6 (six) hours as needed for nausea or vomiting.     RABEprazole 20 MG tablet  Commonly known as:  ACIPHEX  Take 20 mg by mouth daily.       Allergies  Allergen Reactions  . Penicillins Other (See Comments)    Unknown  . Sulfonamide Derivatives Other (See Comments)    Unknown   Follow-up Information    Follow up with Carylon PerchesFAGAN,ROY, MD. Schedule an appointment as soon as possible for a visit in 1 week.   Specialty:  Internal Medicine   Why:  Hospital followup   Contact information:   [address] [place] [address] [phone]        The results of significant diagnostics from this hospitalization (including imaging, microbiology, ancillary and laboratory) are listed below for reference.    Significant Diagnostic Studies: Dg Chest 2 View  02/24/2014   CLINICAL DATA:  80 year old female with weakness and nausea. Shortness breath. No chest pain. Initial encounter.  EXAM: CHEST  2 VIEW  COMPARISON:  01/31/2014 CT.  01/30/2014 chest x-ray.  FINDINGS: Sequential pacemaker enters from the left with leads in the region of the right atrium and right ventricle. Cardiomegaly.  Central pulmonary vascular prominence without frank pulmonary edema.  Left base subsegmental atelectasis.  No gross pneumothorax.  Calcified ectatic aorta.  Prior left shoulder replacement.  Marked right shoulder joint degenerative changes.  IMPRESSION: Central pulmonary vascular prominence without frank pulmonary edema.  Mild left base subsegmental atelectasis.  Cardiomegaly with pacer in place.  Calcified tortuous aorta.   Electronically Signed   By: Bridgett LarssonSteve  Olson M.D.   On: 02/24/2014 17:31   Dg Chest 2 View  01/30/2014   CLINICAL DATA:  Difficulty breathing  EXAM: CHEST  2 VIEW  COMPARISON:  December 08, 2012  FINDINGS: There is a degree of underlying emphysematous change. There is no edema or consolidation. Heart is mildly enlarged with pulmonary vascularity within normal limits. Pacemaker leads are attached to the right atrium and right ventricle. No pneumothorax. There is a total shoulder replacement on the left. Bones overall are osteoporotic.  IMPRESSION: Underlying emphysematous change. Mild cardiomegaly. No edema or consolidation.   Electronically Signed   By: Bretta BangWilliam  Woodruff M.D.   On: 01/30/2014 19:14   Ct Angio Chest Pe W/cm &/or Wo Cm  01/31/2014   CLINICAL DATA:  Weakness shortness of breath x1 day. Initial evaluation.  EXAM: CT ANGIOGRAPHY CHEST WITH CONTRAST  TECHNIQUE: Multidetector CT imaging of the  chest was performed using the standard protocol during bolus administration of intravenous contrast. Multiplanar CT image reconstructions and MIPs were obtained to evaluate the vascular anatomy.  CONTRAST:  100mL OMNIPAQUE IOHEXOL 350 MG/ML SOLN  COMPARISON:  Chest x-ray 01/30/2014.  FINDINGS: Thoracic aorta normal caliber. No evidence of aneurysm or dissection. Atherosclerotic vascular changes are noted. Pulmonary arteries are normal. Cardiomegaly. Coronary artery disease. Cardiac pacer. Small pericardial effusion.  Shotty mediastinal lymph nodes.  Thoracic esophagus unremarkable.  Large airways are patent. Mild bibasilar atelectasis. No significant pleural effusion or pneumothorax.  Adrenals are unremarkable. Sludge and/or stones may be present in the gallbladder. Further evaluation with right upper quadrant ultrasound can be obtained.  Visualized thyroid unremarkable. Shotty axillary lymph nodes. Severe degenerative changes thoracic spine.  Review of the MIP images confirms the above findings.  IMPRESSION: 1. No evidence of pulmonary embolus. 2. Coronary artery disease. 3. Small pericardial effusion. 4. Cardiomegaly.  Cardiac pacer is present. 5. Mild basilar subsegmental atelectasis. 6. Possible sludge and/or stones in the gallbladder. Further evaluation with right upper quadrant ultrasound can be obtained as needed.   Electronically Signed   By: Maisie Fushomas  Register   On: 01/31/2014 02:56    Microbiology: Recent Results (from the past 240 hour(s))  Urine culture     Status: None   Collection Time: 02/24/14  5:55 PM  Result Value Ref Range Status   Specimen Description URINE, CLEAN CATCH  Final   Special Requests NONE  Final   Culture  Setup Time   Final    02/24/2014 22:41 Performed at [facility]    Colony Count   Final    >=100,000 COLONIES/ML Performed at [facility]    Culture   Final    Multiple bacterial morphotypes present, none predominant. Suggest appropriate  recollection if clinically indicated. Performed at [facility]    Report Status 02/25/2014 FINAL  Final     Labs: Basic Metabolic Panel:  Recent Labs Lab 02/24/14 1627 02/25/14 0652 02/26/14 0605  NA 130* 137 137  K 5.3 4.6 5.0  CL 93* 101 103  CO2 27 26 25   GLUCOSE 118* 77 108*  BUN 18 16 18   CREATININE 1.48* 1.39* 1.54*  CALCIUM 10.0 9.4 9.4   Liver Function Tests:  Recent Labs Lab 02/24/14 1627  AST 15  ALT 6  ALKPHOS 77  BILITOT 0.2*  PROT 6.8  ALBUMIN 3.5   No results for input(s): LIPASE, AMYLASE in the last 168 hours. No results for input(s): AMMONIA in the last 168 hours. CBC:  Recent Labs Lab 02/24/14 1627 02/26/14 0605  WBC 5.8 4.5  NEUTROABS 4.0  --   HGB 12.8 11.6*  HCT 38.5 35.2*  MCV 89.5 91.0  PLT  189 161   Cardiac Enzymes:  Recent Labs Lab 02/24/14 1627  TROPONINI <0.30   BNP: BNP (last 3 results)  Recent Labs  01/30/14 1850 02/24/14 1627  PROBNP 792.8* 386.7   CBG: No results for input(s): GLUCAP in the last 168 hours.     SignedEdsel Petrin  [facility] 02/27/2014, 11:50 AM

## 2014-02-27 NOTE — Clinical Social Work Placement (Signed)
Clinical Social Work Department CLINICAL SOCIAL WORK PLACEMENT NOTE 02/27/2014  Patient:  Kelli [name],Kelli [name]  Account Number:  [id number] Admit date:  02/24/2014  Clinical Social Worker:  Cherre BlancJOSEPH BRYANT Maryelizabeth Eberle, [place]  Date/time:  02/27/2014 01:08 PM  Clinical Social Work is seeking post-discharge placement for this patient at the following level of care:   SKILLED NURSING   (*CSW will update this form in Epic as items are completed)   02/27/2014  Patient/family provided with [facility] Department of Clinical Social Work's list of facilities offering this level of care within the geographic area requested by the patient (or if unable, by the patient's family).  02/27/2014  Patient/family informed of their freedom to choose among providers that offer the needed level of care, that participate in Medicare, Medicaid or managed care program needed by the patient, have an available bed and are willing to accept the patient.  02/27/2014  Patient/family informed of [facility]' ownership interest in [facility], as well as of the fact that they are under no obligation to receive care at this facility.  PASARR submitted to EDS on  PASARR number received on   FL2 transmitted to all facilities in geographic area requested by pt/family on  02/27/2014 FL2 transmitted to all facilities within larger geographic area on   Patient informed that his/her managed care company has contracts with or will negotiate with  certain facilities, including the following:     Patient/family informed of bed offers received:  02/27/2014 Patient chooses bed at Other Physician recommends and patient chooses bed at    Patient to be transferred to Other on  02/27/2014 Patient to be transferred to facility by Patient's son will transport Patient and family notified of transfer on 02/27/2014 Name of family member notified:  Dannielle Huhanny and Brett CanalesSteve  The following physician request were entered in  Epic:   Additional Comments:   Per MD patient ready for DC to [facility]. RN, patient, patient's family, and facility notified of DC. RN given number for report. DC packet on chart. [facility] transport requested for patient. CSW signing off.    Roddie McBryant Josey Dettmann MSW, [place]CSWA, [place], [phone]

## 2014-02-27 NOTE — Care Management Note (Signed)
    Page 1 of 1   02/27/2014     10:53:25 AM CARE MANAGEMENT NOTE 02/27/2014  Patient:  Kelli [name],Kelli [name]   Account Number:  [id number]  Date Initiated:  02/27/2014  Documentation initiated by:  Letha CapeAYLOR,Aiman Sonn  Subjective/Objective Assessment:   dx dehydration  admit- lives alone,     Action/Plan:   pt eval- rec snf.   Anticipated DC Date:  02/27/2014   Anticipated DC Plan:  SKILLED NURSING FACILITY  In-house referral  Clinical Social Worker      DC Planning Services  CM consult      Choice offered to / List presented to:             Status of service:  Completed, signed off Medicare Important Message given?  YES (If response is "NO", the following Medicare IM given date fields will be blank) Date Medicare IM given:  02/27/2014 Medicare IM given by:  Letha CapeAYLOR,Floreine Kingdon Date Additional Medicare IM given:   Additional Medicare IM given by:    Discharge Disposition:  SKILLED NURSING FACILITY  Per UR Regulation:  Reviewed for med. necessity/level of care/duration of stay  If discussed at Long Length of Stay Meetings, dates discussed:    Comments:  02/27/14 1052 Letha Capeeborah Bethanny Toelle RN, BSN [phone] patient is for dc today to snf, CSW following.

## 2014-02-27 NOTE — Progress Notes (Signed)
Nsg Discharge Note  Admit Date:  02/24/2014 Discharge date: 02/27/2014   Kelli [name] to be D/C'd Nursing Home [name] per MD order.  AVS completed.  Copy for chart, and copy for patient signed, and dated. Patient/caregiver able to verbalize understanding.  Discharge Medication:   Medication List    TAKE these medications        ALPRAZolam 0.25 MG tablet  Commonly known as:  XANAX  Take 0.5-1 tablets (0.125-0.25 mg total) by mouth 2 (two) times daily as needed for anxiety.     amLODipine 10 MG tablet  Commonly known as:  NORVASC  Take 10 mg by mouth daily.     antiseptic oral rinse 0.05 % Liqd solution  Commonly known as:  CPC / CETYLPYRIDINIUM CHLORIDE 0.05%  7 mLs by Mouth Rinse route 2 (two) times daily.     aspirin 81 MG tablet  Take 81 mg by mouth daily.     colchicine 0.6 MG tablet  Take 0.6 mg by mouth daily as needed (Gout).     lactose free nutrition Liqd  Take 237 mLs by mouth 3 (three) times daily with meals.     levothyroxine 88 MCG tablet  Commonly known as:  SYNTHROID, LEVOTHROID  Take 88 mcg by mouth daily before breakfast.     lisinopril 20 MG tablet  Commonly known as:  PRINIVIL,ZESTRIL  Take 20 mg by mouth daily.     metoprolol succinate 25 MG 24 hr tablet  Commonly known as:  TOPROL-XL  Take 25 mg by mouth daily.     mirtazapine 7.5 MG tablet  Commonly known as:  REMERON  Take 1 tablet (7.5 mg total) by mouth at bedtime.     prochlorperazine 10 MG tablet  Commonly known as:  COMPAZINE  Take 10 mg by mouth every 6 (six) hours as needed for nausea or vomiting.     RABEprazole 20 MG tablet  Commonly known as:  ACIPHEX  Take 20 mg by mouth daily.        Discharge Assessment: Filed Vitals:   02/27/14 1100  BP: 147/48  Pulse:   Temp:   Resp:    Skin clean, dry and intact without evidence of skin break down, no evidence of skin tears noted. IV catheter discontinued intact. Site without signs and symptoms of complications - no  redness or edema noted at insertion site, patient denies c/o pain - only slight tenderness at site.  Dressing with slight pressure applied.  D/c Instructions-Education: Discharge instructions given to patient/family with verbalized understanding. D/c education completed with patient/family including follow up instructions, medication list, d/c activities limitations if indicated, with other d/c instructions as indicated by MD - patient able to verbalize understanding, all questions fully answered. Patient instructed to return to ED, call 911, or call MD for any changes in condition.  Patient escorted via Son to Nursing Home. Attempted to call report times one, message left with return phone number.   Kern ReapBrumagin, Elison Worrel L, RN 02/27/2014 1:01 PM

## 2014-02-27 NOTE — Clinical Social Work Psychosocial (Signed)
Clinical Social Work Department BRIEF PSYCHOSOCIAL ASSESSMENT 02/27/2014  Patient:  Kelli [name],Kelli [name]     Account Number:  [id number]     Admit date:  02/24/2014  Clinical Social Worker:  Lavell LusterAMPBELL,Lunah Losasso BRYANT, LCSWA  Date/Time:  02/26/2014 01:04 PM  Referred by:  Physician  Date Referred:  02/27/2014 Referred for  SNF Placement   Other Referral:   NA   Interview type:  Family Other interview type:   Patient's son Kelli [name] interviewed.    PSYCHOSOCIAL DATA Living Status:  ALONE Admitted from facility:   Level of care:   Primary support name:  Kelli [name] Primary support relationship to patient:  CHILD, ADULT Degree of support available:   Support is strong.    CURRENT CONCERNS Current Concerns  Post-Acute Placement   Other Concerns:   NA    SOCIAL WORK ASSESSMENT / PLAN CSW spoke with patient's son regarding SNF placement to complete assessment. Kelli HuhDanny states that the plan is for the patient to discharge to [facility] in [place], [place]. Per Kelli Huhanny, patient lives alone and was "pretty self sufficient until about 3 weeks ago." CSW explained SNF search/placement process and answered family's questions. CSW will assist with Dc.   Assessment/plan status:  Psychosocial Support/Ongoing Assessment of Needs Other assessment/ plan:   Complete Fl2, Fax, PASRR   Information/referral to community resources:   CSW contact information given.    PATIENT'S/FAMILY'S RESPONSE TO PLAN OF CARE: Patient and family plan for a DC to [facility] when medically stable. CSW will assist.       Kelli [name] MSW, [place], [place], [phone]

## 2014-02-27 NOTE — Discharge Instructions (Signed)
Dehydration, Adult Dehydration is when you lose more fluids from the body than you take in. Vital organs like the kidneys, brain, and heart cannot function without a proper amount of fluids and salt. Any loss of fluids from the body can cause dehydration.  CAUSES   Vomiting.  Diarrhea.  Excessive sweating.  Excessive urine output.  Fever. SYMPTOMS  Mild dehydration  Thirst.  Dry lips.  Slightly dry mouth. Moderate dehydration  Very dry mouth.  Sunken eyes.  Skin does not bounce back quickly when lightly pinched and released.  Dark urine and decreased urine production.  Decreased tear production.  Headache. Severe dehydration  Very dry mouth.  Extreme thirst.  Rapid, weak pulse (more than 100 beats per minute at rest).  Cold hands and feet.  Not able to sweat in spite of heat and temperature.  Rapid breathing.  Blue lips.  Confusion and lethargy.  Difficulty being awakened.  Minimal urine production.  No tears. DIAGNOSIS  Your caregiver will diagnose dehydration based on your symptoms and your exam. Blood and urine tests will help confirm the diagnosis. The diagnostic evaluation should also identify the cause of dehydration. TREATMENT  Treatment of mild or moderate dehydration can often be done at home by increasing the amount of fluids that you drink. It is best to drink small amounts of fluid more often. Drinking too much at one time can make vomiting worse. Refer to the home care instructions below. Severe dehydration needs to be treated at the hospital where you will probably be given intravenous (IV) fluids that contain water and electrolytes. HOME CARE INSTRUCTIONS   Ask your caregiver about specific rehydration instructions.  Drink enough fluids to keep your urine clear or pale yellow.  Drink small amounts frequently if you have nausea and vomiting.  Eat as you normally do.  Avoid:  Foods or drinks high in sugar.  Carbonated  drinks.  Juice.  Extremely hot or cold fluids.  Drinks with caffeine.  Fatty, greasy foods.  Alcohol.  Tobacco.  Overeating.  Gelatin desserts.  Wash your hands well to avoid spreading bacteria and viruses.  Only take over-the-counter or prescription medicines for pain, discomfort, or fever as directed by your caregiver.  Ask your caregiver if you should continue all prescribed and over-the-counter medicines.  Keep all follow-up appointments with your caregiver. SEEK MEDICAL CARE IF:  You have abdominal pain and it increases or stays in one area (localizes).  You have a rash, stiff neck, or severe headache.  You are irritable, sleepy, or difficult to awaken.  You are weak, dizzy, or extremely thirsty. SEEK IMMEDIATE MEDICAL CARE IF:   You are unable to keep fluids down or you get worse despite treatment.  You have frequent episodes of vomiting or diarrhea.  You have blood or green matter (bile) in your vomit.  You have blood in your stool or your stool looks black and tarry.  You have not urinated in 6 to 8 hours, or you have only urinated a small amount of very dark urine.  You have a fever.  You faint. MAKE SURE YOU:   Understand these instructions.  Will watch your condition.  Will get help right away if you are not doing well or get worse. Document Released: 04/10/2005 Document Revised: 07/03/2011 Document Reviewed: 11/28/2010 ExitCare Patient Information 2015 [employer]. This information is not intended to replace advice given to you by your health care provider. Make sure you discuss any questions you have with your health care   provider.  

## 2014-02-28 NOTE — ED Provider Notes (Signed)
CSN: [id number]     Arrival date & time 02/24/14  1616 History   First MD Initiated Contact with Patient 02/24/14 1646     Chief Complaint  Patient presents with  . Weakness  . Altered Mental Status  . Shortness of Breath     (Consider location/radiation/quality/duration/timing/severity/associated sxs/prior Treatment) HPI Patient presents to the emergency department with generalized weakness with some altered mental status and confusion.  The patient and family are concerned because she has become weaker over the last few months.  The patient denies chest pain, shortness of breath, nausea, vomiting, diarrhea, , dizziness, headache, blurred vision, numbness, rash, fever, or syncope. the patient states that she just feels generalized fatigue.patient states that nothing seems to make her condition better or worse Past Medical History  Diagnosis Date  . Vasovagal syncope   . Hypertension   . Pacemaker     Medtronic DDD  . Carotid artery disease   . Hypothyroidism   . GERD (gastroesophageal reflux disease)   . CHF (congestive heart failure)     diastolic in nature  . Sleep apnea   . Supraventricular arrhythmia    Past Surgical History  Procedure Laterality Date  . Pacemaker insertion    . Back surgery     History reviewed. No pertinent family history. History  Substance Use Topics  . Smoking status: Never Smoker   . Smokeless tobacco: Never Used  . Alcohol Use: No   OB History    No data available     Review of Systems  All other systems negative except as documented in the HPI. All pertinent positives and negatives as reviewed in the HPI.  Allergies  Penicillins and Sulfonamide derivatives  Home Medications   Prior to Admission medications   Medication Sig Start Date End Date Taking? Authorizing Provider  amLODipine (NORVASC) 10 MG tablet Take 10 mg by mouth daily.     Yes Historical Provider, MD  aspirin 81 MG tablet Take 81 mg by mouth daily.     Yes Historical  Provider, MD  colchicine 0.6 MG tablet Take 0.6 mg by mouth daily as needed (Gout).   Yes Historical Provider, MD  levothyroxine (SYNTHROID, LEVOTHROID) 88 MCG tablet Take 88 mcg by mouth daily before breakfast.   Yes Historical Provider, MD  lisinopril (PRINIVIL,ZESTRIL) 20 MG tablet Take 20 mg by mouth daily.     Yes Historical Provider, MD  metoprolol succinate (TOPROL-XL) 25 MG 24 hr tablet Take 25 mg by mouth daily.   Yes Historical Provider, MD  prochlorperazine (COMPAZINE) 10 MG tablet Take 10 mg by mouth every 6 (six) hours as needed for nausea or vomiting.   Yes Historical Provider, MD  RABEprazole (ACIPHEX) 20 MG tablet Take 20 mg by mouth daily.   Yes Historical Provider, MD  ALPRAZolam (XANAX) 0.25 MG tablet Take 0.5-1 tablets (0.125-0.25 mg total) by mouth 2 (two) times daily as needed for anxiety. 02/27/14   Maryann Mikhail, DO  antiseptic oral rinse (CPC / CETYLPYRIDINIUM CHLORIDE 0.05%) 0.05 % LIQD solution 7 mLs by Mouth Rinse route 2 (two) times daily. 02/27/14   Maryann Mikhail, DO  lactose free nutrition (BOOST PLUS) LIQD Take 237 mLs by mouth 3 (three) times daily with meals. 02/27/14   Maryann Mikhail, DO  mirtazapine (REMERON) 7.5 MG tablet Take 1 tablet (7.5 mg total) by mouth at bedtime. 02/27/14   Maryann Mikhail, DO   BP 147/48 mmHg  Pulse 66  Temp(Src) 98 F (36.7 C) (Oral)  Resp 18  Ht 5\' 2"  (1.575 m)  Wt 150 lb 5.7 oz (68.2 kg)  BMI 27.49 kg/m2  SpO2 94% Physical Exam  Constitutional: She is oriented to person, place, and time. She appears well-developed and well-nourished. No distress.  HENT:  Head: Normocephalic and atraumatic.  Mouth/Throat: Oropharynx is clear and moist.  Eyes: Pupils are equal, round, and reactive to light.  Neck: Normal range of motion. Neck supple.  Cardiovascular: Normal rate, regular rhythm and normal heart sounds.  Exam reveals no gallop and no friction rub.   No murmur heard. Pulmonary/Chest: Effort normal and breath sounds normal.  No respiratory distress.  Neurological: She is alert and oriented to person, place, and time. She exhibits normal muscle tone. Coordination normal.  Skin: Skin is warm and dry. No rash noted. No erythema.  Nursing note and vitals reviewed.   ED Course  Procedures (including critical care time) Labs Review Labs Reviewed  COMPREHENSIVE METABOLIC PANEL - Abnormal; Notable for the following:    Sodium 130 (*)    Chloride 93 (*)    Glucose, Bld 118 (*)    Creatinine, Ser 1.48 (*)    Total Bilirubin 0.2 (*)    GFR calc non Af Amer 29 (*)    GFR calc Af Amer 33 (*)    All other components within normal limits  URINALYSIS, ROUTINE W REFLEX MICROSCOPIC - Abnormal; Notable for the following:    Specific Gravity, Urine 1.004 (*)    All other components within normal limits  BASIC METABOLIC PANEL - Abnormal; Notable for the following:    Creatinine, Ser 1.39 (*)    GFR calc non Af Amer 31 (*)    GFR calc Af Amer 36 (*)    All other components within normal limits  TSH - Abnormal; Notable for the following:    TSH 5.840 (*)    All other components within normal limits  CBC - Abnormal; Notable for the following:    Hemoglobin 11.6 (*)    HCT 35.2 (*)    All other components within normal limits  BASIC METABOLIC PANEL - Abnormal; Notable for the following:    Glucose, Bld 108 (*)    Creatinine, Ser 1.54 (*)    GFR calc non Af Amer 27 (*)    GFR calc Af Amer 32 (*)    All other components within normal limits  URINE CULTURE  CBC WITH DIFFERENTIAL  PRO B NATRIURETIC PEPTIDE  TROPONIN I  VITAMIN B12  T4, FREE  I-STAT TROPOININ, ED  I-STAT CG4 LACTIC ACID, ED    Imaging Review No results found.   EKG Interpretation   Date/Time:  Tuesday February 24 2014 16:30:50 EST Ventricular Rate:  61 PR Interval:  210 QRS Duration: 122 QT Interval:  456 QTC Calculation: 459 R Axis:   87 Text Interpretation:  Atrial-paced rhythm with prolonged AV conduction  Right bundle branch block  Septal infarct , age undetermined Abnormal ECG  No significant change was found Confirmed by CAMPOS  MD, Caryn BeeKEVIN ([id number]) on  02/24/2014 7:23:06 PM      MDM   Final diagnoses:  Anorexia    Patient has not been eating or drinking very well over this timeframe.  She will be admitted to the hospital for further evaluation and care.  Patient does appear dehydrated on clinical examination    Carlyle DollyChristopher W Akim Watkinson, PA-C 02/28/14 0200  Lyanne CoKevin M Campos, MD 03/02/14 [phone]

## 2014-04-02 ENCOUNTER — Telehealth: Payer: Self-pay | Admitting: Cardiology

## 2014-04-02 ENCOUNTER — Encounter: Payer: Medicare Other | Admitting: *Deleted

## 2014-04-02 NOTE — Telephone Encounter (Signed)
Attempted confirm remote transmission w/ pt. No answer and unable to leave a message.

## 2014-04-03 ENCOUNTER — Encounter: Payer: Self-pay | Admitting: Cardiology

## 2014-08-14 ENCOUNTER — Encounter: Payer: Self-pay | Admitting: *Deleted

## 2015-04-06 ENCOUNTER — Encounter: Payer: Self-pay | Admitting: Internal Medicine

## 2015-04-06 ENCOUNTER — Ambulatory Visit (INDEPENDENT_AMBULATORY_CARE_PROVIDER_SITE_OTHER): Payer: Medicare Other | Admitting: Internal Medicine

## 2015-04-06 VITALS — BP 120/68 | HR 68 | Ht 62.0 in | Wt 160.0 lb

## 2015-04-06 DIAGNOSIS — I5032 Chronic diastolic (congestive) heart failure: Secondary | ICD-10-CM | POA: Diagnosis not present

## 2015-04-06 DIAGNOSIS — Z950 Presence of cardiac pacemaker: Secondary | ICD-10-CM | POA: Diagnosis not present

## 2015-04-06 DIAGNOSIS — G4700 Insomnia, unspecified: Secondary | ICD-10-CM

## 2015-04-06 DIAGNOSIS — I10 Essential (primary) hypertension: Secondary | ICD-10-CM | POA: Diagnosis not present

## 2015-04-06 NOTE — Assessment & Plan Note (Signed)
She admits to sleeping during the daytime. I have asked her to stop doing so.

## 2015-04-06 NOTE — Assessment & Plan Note (Signed)
Her symptoms are currently class 2. She will maintain a low sodium diet. No change in meds.

## 2015-04-06 NOTE — Assessment & Plan Note (Signed)
Her medtronic DDD PM is working normally. Will recheck in several months. 

## 2015-04-06 NOTE — Progress Notes (Signed)
HPI [name] returns today for followup. She is a pleasant 81yo woman with a h/o symptomatic bradycardia, s/p PPM, HTN, and diastolic CHF. In the interim she has done well.  No syncope. No peripheral edema. No falls. She notes mild symptoms of peripheral edema. She c/o not sleeping very well at night time. Allergies  Allergen Reactions  . Levaquin [Levofloxacin]     "completely lost my mind"   . Penicillins Other (See Comments)    Unknown  . Sulfonamide Derivatives Other (See Comments)    Unknown     Current Outpatient Prescriptions  Medication Sig Dispense Refill  . ALPRAZolam (XANAX) 0.25 MG tablet Take 0.5-1 tablets (0.125-0.25 mg total) by mouth 2 (two) times daily as needed for anxiety. 30 tablet 0  . amLODipine (NORVASC) 10 MG tablet Take 10 mg by mouth daily.      Marland Kitchen. antiseptic oral rinse (CPC / CETYLPYRIDINIUM CHLORIDE 0.05%) 0.05 % LIQD solution 7 mLs by Mouth Rinse route 2 (two) times daily.  0  . aspirin 81 MG tablet Take 81 mg by mouth daily.      . colchicine 0.6 MG tablet Take 0.6 mg by mouth daily as needed (Gout).    Marland Kitchen. lactose free nutrition (BOOST PLUS) LIQD Take 237 mLs by mouth 3 (three) times daily with meals.  0  . levothyroxine (SYNTHROID, LEVOTHROID) 100 MCG tablet Take 100 mcg by mouth daily before breakfast.    . metoprolol succinate (TOPROL-XL) 25 MG 24 hr tablet Take 25 mg by mouth daily.    . mirtazapine (REMERON) 7.5 MG tablet Take 1 tablet (7.5 mg total) by mouth at bedtime. 30 tablet 0  . prochlorperazine (COMPAZINE) 10 MG tablet Take 10 mg by mouth every 6 (six) hours as needed for nausea or vomiting.    . RABEprazole (ACIPHEX) 20 MG tablet Take 20 mg by mouth daily.    Marland Kitchen. torsemide (DEMADEX) 10 MG tablet Take 10 mg by mouth daily.     No current facility-administered medications for this visit.     Past Medical History  Diagnosis Date  . Vasovagal syncope   . Hypertension   . Pacemaker     Medtronic DDD  . Carotid artery disease (HCC)   .  Hypothyroidism   . GERD (gastroesophageal reflux disease)   . CHF (congestive heart failure) (HCC)     diastolic in nature  . Sleep apnea   . Supraventricular arrhythmia     ROS:   All systems reviewed and negative except as noted in the HPI.   Past Surgical History  Procedure Laterality Date  . Pacemaker insertion    . Back surgery       No family history on file.   Social History   Social History  . Marital Status: Widowed    Spouse Name: N/A  . Number of Children: N/A  . Years of Education: N/A   Occupational History  . Not on file.   Social History Main Topics  . Smoking status: Never Smoker   . Smokeless tobacco: Never Used  . Alcohol Use: No  . Drug Use: No  . Sexual Activity: No   Other Topics Concern  . Not on file   Social History Narrative     BP 120/68 mmHg  Pulse 68  Ht 5\' 2"  (1.575 m)  Wt 160 lb (72.576 kg)  BMI 29.26 kg/m2  SpO2 94%  Physical Exam:  Well appearing elderly woman, NAD HEENT: Unremarkable Neck:  No JVD, no thyromegally  Back:  No CVA tenderness Lungs:  Clear with no wheezes, rales, or rhonchi HEART:  Regular rate rhythm, no murmurs, no rubs, no clicks Abd:  soft, positive bowel sounds, no organomegally, no rebound, no guarding Ext:  2 plus pulses, no edema, no cyanosis, no clubbing Skin:  No rashes no nodules Neuro:  CN II through XII intact, motor grossly intact  DEVICE  Normal device function.  See PaceArt for details.   Assess/Plan:

## 2015-04-06 NOTE — Patient Instructions (Signed)

## 2015-04-06 NOTE — Assessment & Plan Note (Signed)
Her blood pressure is well controlled. No change in meds. 

## 2015-04-12 LAB — CUP PACEART INCLINIC DEVICE CHECK
Battery Voltage: 2.8 V
Brady Statistic AP VS Percent: 76 %
Brady Statistic AS VP Percent: 0 %
Brady Statistic AS VS Percent: 24 %
Date Time Interrogation Session: 20161213184002
Implantable Lead Implant Date: 20110308
Implantable Lead Location: 753859
Implantable Lead Model: 5076
Lead Channel Impedance Value: 456 Ohm
Lead Channel Pacing Threshold Amplitude: 0.5 V
Lead Channel Pacing Threshold Amplitude: 0.625 V
Lead Channel Pacing Threshold Pulse Width: 0.4 ms
Lead Channel Pacing Threshold Pulse Width: 0.4 ms
Lead Channel Sensing Intrinsic Amplitude: 1.4 mV
Lead Channel Sensing Intrinsic Amplitude: 8 mV
Lead Channel Setting Pacing Amplitude: 2 V
Lead Channel Setting Pacing Amplitude: 2.5 V
Lead Channel Setting Pacing Pulse Width: 0.4 ms
MDC IDC LEAD IMPLANT DT: 20110308
MDC IDC LEAD LOCATION: 753860
MDC IDC MSMT BATTERY IMPEDANCE: 230 Ohm
MDC IDC MSMT BATTERY REMAINING LONGEVITY: 121 mo
MDC IDC MSMT LEADCHNL RA PACING THRESHOLD AMPLITUDE: 0.5 V
MDC IDC MSMT LEADCHNL RA PACING THRESHOLD AMPLITUDE: 0.5 V
MDC IDC MSMT LEADCHNL RA PACING THRESHOLD PULSEWIDTH: 0.4 ms
MDC IDC MSMT LEADCHNL RA PACING THRESHOLD PULSEWIDTH: 0.4 ms
MDC IDC MSMT LEADCHNL RV IMPEDANCE VALUE: 610 Ohm
MDC IDC SET LEADCHNL RV SENSING SENSITIVITY: 2.8 mV
MDC IDC STAT BRADY AP VP PERCENT: 0 %

## 2015-07-06 ENCOUNTER — Telehealth: Payer: Self-pay | Admitting: Internal Medicine

## 2015-07-06 NOTE — Telephone Encounter (Signed)
Spoke w/ pt nurse and she informed me that pt does indeed need a new monitor and wirex adapter. Ordered the wirex adapter and monitor. They verbalized understanding that it should be at the facility in the next couple of weeks.

## 2015-07-06 NOTE — Telephone Encounter (Signed)
New. Calling to get information on how to send the remote check.. Thanks

## 2015-07-06 NOTE — Telephone Encounter (Signed)
Spoke w/ pt nurse and informed her that pt home monitor is not wireless and it will not transmit automatically. She said that pt son is not sure if pt has a home monitor or not. I explained to p nurse that if they monitor can not be found we can order a new one. She is going to call me back and let me know if a new monitor and wirex adapter needs to be ordered. She also wanted printed instructions for their recorders on how to use the monitor and wirex adapter. I informed her that I would fax it over to her once I have it ready. She verbalized understanding.

## 2015-07-12 ENCOUNTER — Telehealth: Payer: Self-pay | Admitting: Internal Medicine

## 2015-07-12 NOTE — Telephone Encounter (Signed)
New message      Calling to get instructions on how to check patient's pacemaker

## 2015-07-12 NOTE — Telephone Encounter (Signed)
Eilene GhaziJeannine is not available. Left message with receptionist that the transmission was received today.

## 2016-04-24 DEATH — deceased

## 2016-07-31 IMAGING — CR DG CHEST 2V
2 series · 2 of 2 positions shown · non-contrast
Comparison: December 08, 2012

CLINICAL DATA: Difficulty breathing

EXAM:
CHEST  2 VIEW

[view not recorded (1 of 2)]
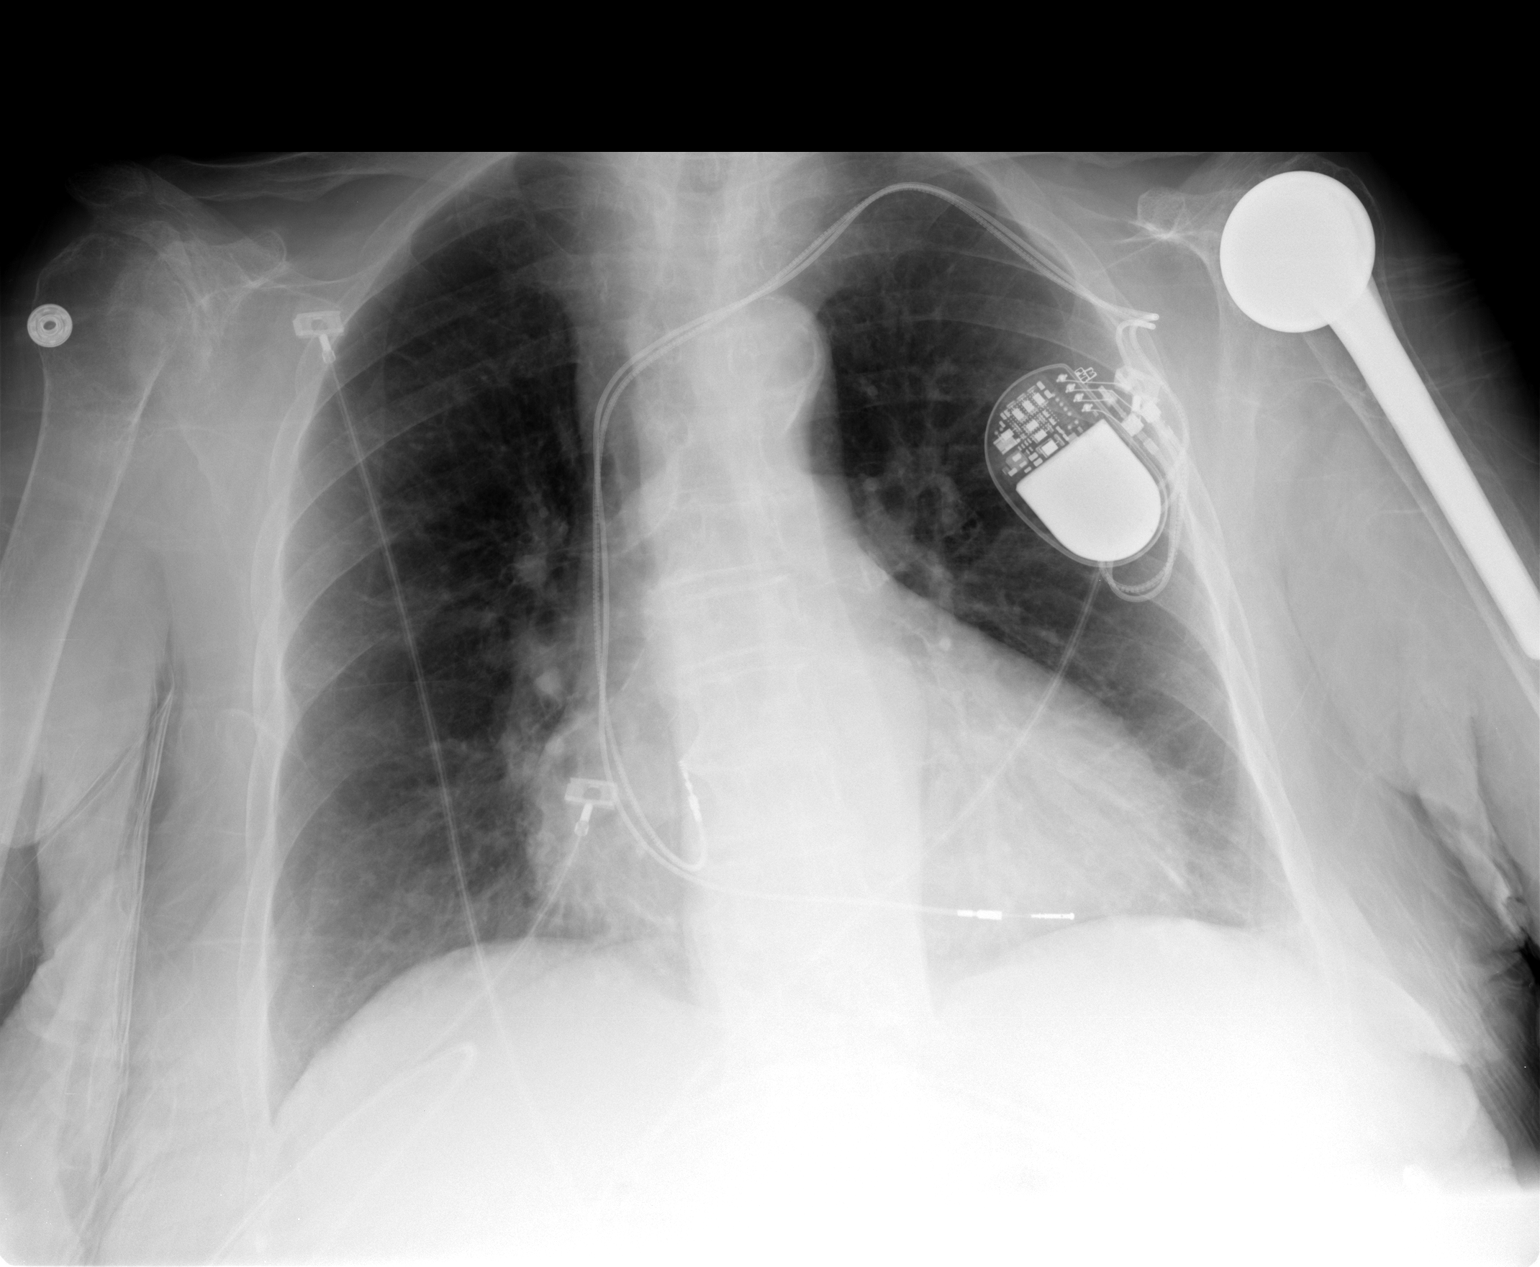

[view not recorded (2 of 2)]
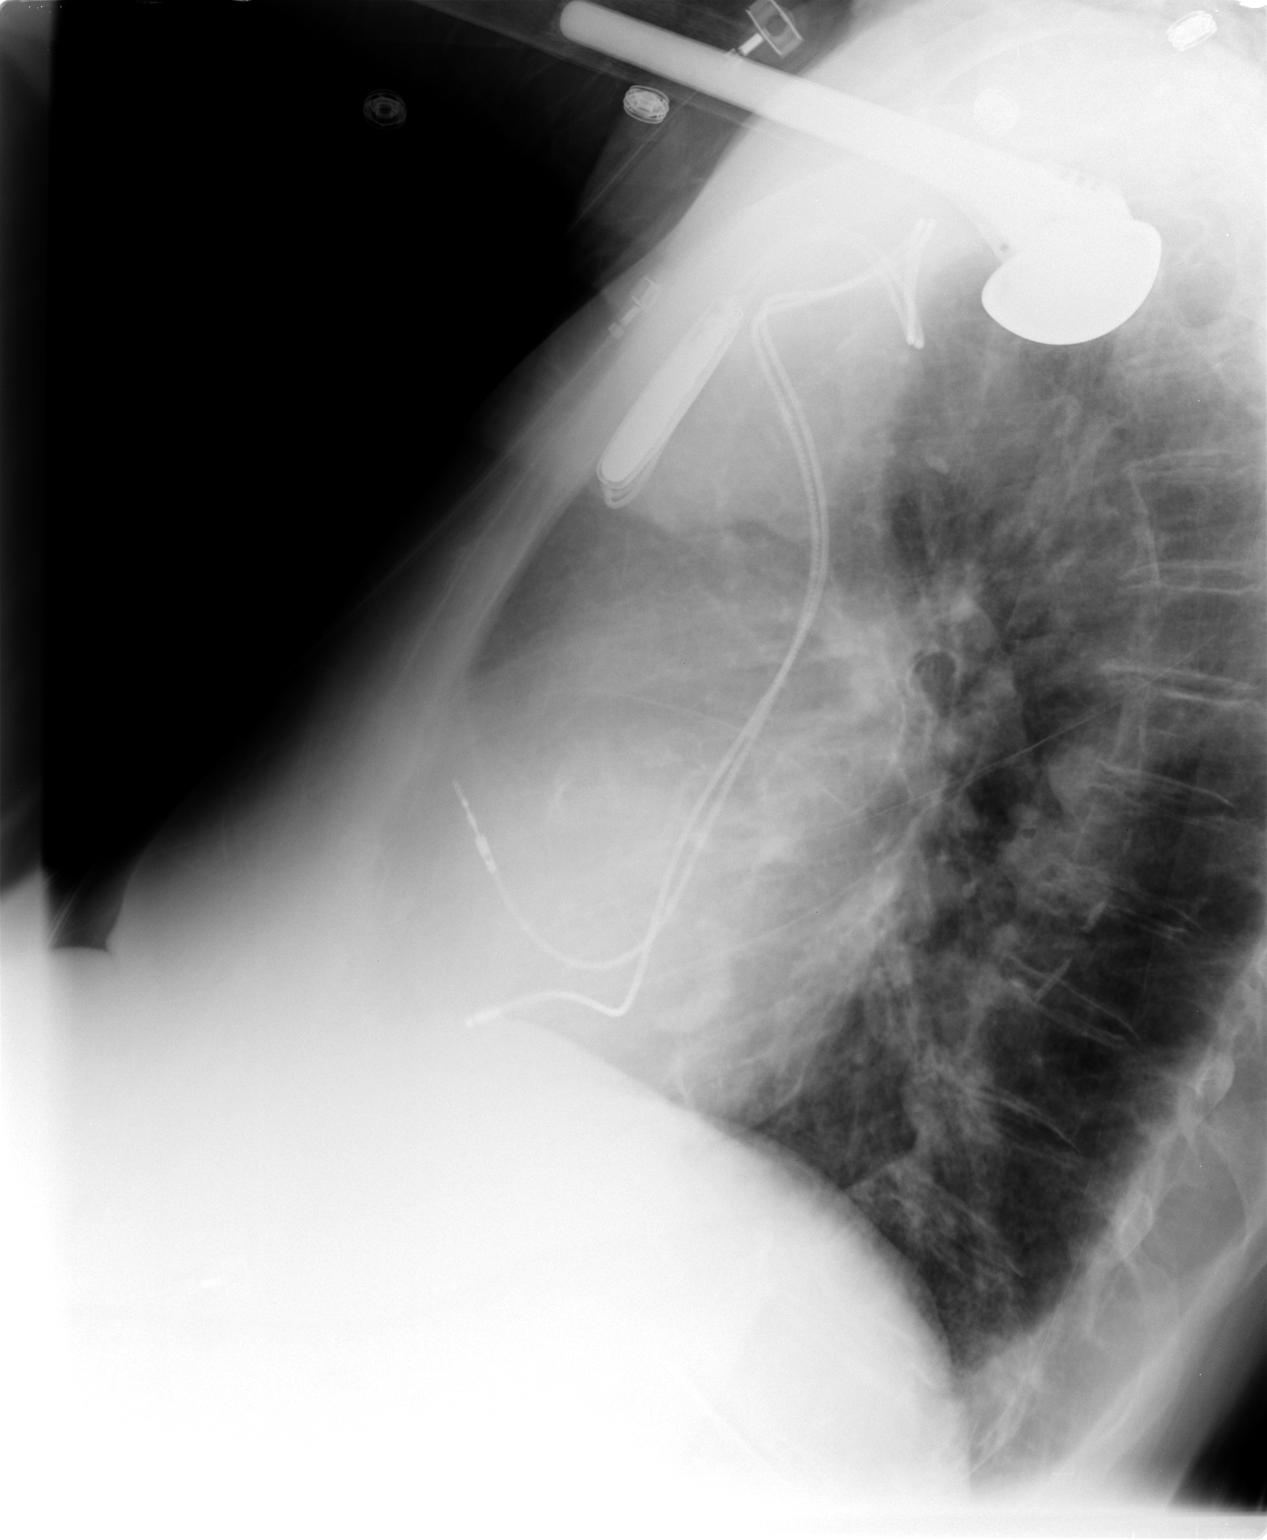

[2 of 2 positions shown; findings below may reference images not displayed]

FINDINGS: There is a degree of underlying emphysematous change. There is no
edema or consolidation. Heart is mildly enlarged with pulmonary
vascularity within normal limits. Pacemaker leads are attached to
the right atrium and right ventricle. No pneumothorax. There is a
total shoulder replacement on the left. Bones overall are
osteoporotic.
IMPRESSION: Underlying emphysematous change. Mild cardiomegaly. No edema or
consolidation.
